# Patient Record
Sex: Male | Born: 1980 | Race: Black or African American | Hispanic: No | Marital: Married | Smoking: Current some day smoker
Health system: Southern US, Community
[De-identification: ages and names within clinical notes are randomized; demographics above are authoritative.]

## PROBLEM LIST (undated history)

## (undated) DIAGNOSIS — S62309A Unspecified fracture of unspecified metacarpal bone, initial encounter for closed fracture: Secondary | ICD-10-CM

## (undated) DIAGNOSIS — R05 Cough: Secondary | ICD-10-CM

## (undated) DIAGNOSIS — A539 Syphilis, unspecified: Secondary | ICD-10-CM

## (undated) DIAGNOSIS — R053 Chronic cough: Secondary | ICD-10-CM

## (undated) DIAGNOSIS — B009 Herpesviral infection, unspecified: Secondary | ICD-10-CM

## (undated) DIAGNOSIS — Z8489 Family history of other specified conditions: Secondary | ICD-10-CM

## (undated) DIAGNOSIS — G43909 Migraine, unspecified, not intractable, without status migrainosus: Secondary | ICD-10-CM

## (undated) HISTORY — PX: KNEE SURGERY: SHX244

---
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
## 2010-10-28 ENCOUNTER — Ambulatory Visit (INDEPENDENT_AMBULATORY_CARE_PROVIDER_SITE_OTHER): Payer: Self-pay

## 2010-10-28 ENCOUNTER — Inpatient Hospital Stay (INDEPENDENT_AMBULATORY_CARE_PROVIDER_SITE_OTHER)
Admission: RE | Admit: 2010-10-28 | Discharge: 2010-10-28 | Disposition: A | Discharge status: Home / Self Care | Payer: Self-pay | Source: Ambulatory Visit | Attending: Family Medicine | Admitting: Family Medicine

## 2010-10-28 DIAGNOSIS — J40 Bronchitis, not specified as acute or chronic: Secondary | ICD-10-CM

## 2010-10-28 DIAGNOSIS — R071 Chest pain on breathing: Secondary | ICD-10-CM

## 2010-10-29 ENCOUNTER — Emergency Department (HOSPITAL_COMMUNITY)
Admission: EM | Admit: 2010-10-29 | Discharge: 2010-10-29 | Disposition: A | Discharge status: Home / Self Care | Payer: Self-pay | Attending: Emergency Medicine | Admitting: Emergency Medicine

## 2010-10-29 DIAGNOSIS — R059 Cough, unspecified: Secondary | ICD-10-CM | POA: Insufficient documentation

## 2010-10-29 DIAGNOSIS — R079 Chest pain, unspecified: Secondary | ICD-10-CM | POA: Insufficient documentation

## 2010-10-29 DIAGNOSIS — R05 Cough: Secondary | ICD-10-CM | POA: Insufficient documentation

## 2010-10-29 DIAGNOSIS — R062 Wheezing: Secondary | ICD-10-CM | POA: Insufficient documentation

## 2010-10-29 DIAGNOSIS — J40 Bronchitis, not specified as acute or chronic: Secondary | ICD-10-CM | POA: Insufficient documentation

## 2011-02-04 ENCOUNTER — Inpatient Hospital Stay (INDEPENDENT_AMBULATORY_CARE_PROVIDER_SITE_OTHER)
Admission: RE | Admit: 2011-02-04 | Discharge: 2011-02-04 | Disposition: A | Discharge status: Home / Self Care | Payer: Self-pay | Source: Ambulatory Visit | Attending: Family Medicine | Admitting: Family Medicine

## 2011-02-04 DIAGNOSIS — J069 Acute upper respiratory infection, unspecified: Secondary | ICD-10-CM

## 2011-02-04 DIAGNOSIS — J40 Bronchitis, not specified as acute or chronic: Secondary | ICD-10-CM

## 2011-03-17 ENCOUNTER — Emergency Department (HOSPITAL_COMMUNITY)
Admission: EM | Admit: 2011-03-17 | Discharge: 2011-03-17 | Disposition: A | Discharge status: Home / Self Care | Payer: Self-pay | Attending: Emergency Medicine | Admitting: Emergency Medicine

## 2011-03-17 DIAGNOSIS — S8010XA Contusion of unspecified lower leg, initial encounter: Secondary | ICD-10-CM | POA: Insufficient documentation

## 2011-03-17 DIAGNOSIS — Y92009 Unspecified place in unspecified non-institutional (private) residence as the place of occurrence of the external cause: Secondary | ICD-10-CM | POA: Insufficient documentation

## 2011-03-17 DIAGNOSIS — Y93E1 Activity, personal bathing and showering: Secondary | ICD-10-CM | POA: Insufficient documentation

## 2011-03-17 DIAGNOSIS — S8990XA Unspecified injury of unspecified lower leg, initial encounter: Secondary | ICD-10-CM | POA: Insufficient documentation

## 2011-03-17 DIAGNOSIS — M79609 Pain in unspecified limb: Secondary | ICD-10-CM | POA: Insufficient documentation

## 2011-03-17 DIAGNOSIS — S99929A Unspecified injury of unspecified foot, initial encounter: Secondary | ICD-10-CM | POA: Insufficient documentation

## 2011-03-17 DIAGNOSIS — W010XXA Fall on same level from slipping, tripping and stumbling without subsequent striking against object, initial encounter: Secondary | ICD-10-CM | POA: Insufficient documentation

## 2011-04-24 ENCOUNTER — Encounter: Payer: Self-pay | Admitting: Emergency Medicine

## 2011-04-24 ENCOUNTER — Emergency Department (HOSPITAL_COMMUNITY): Payer: Self-pay

## 2011-04-24 ENCOUNTER — Emergency Department (HOSPITAL_COMMUNITY)
Admission: EM | Admit: 2011-04-24 | Discharge: 2011-04-24 | Disposition: A | Discharge status: Home / Self Care | Payer: Self-pay | Attending: Emergency Medicine | Admitting: Emergency Medicine

## 2011-04-24 DIAGNOSIS — M79609 Pain in unspecified limb: Secondary | ICD-10-CM | POA: Insufficient documentation

## 2011-04-24 DIAGNOSIS — IMO0002 Reserved for concepts with insufficient information to code with codable children: Secondary | ICD-10-CM | POA: Insufficient documentation

## 2011-04-24 DIAGNOSIS — T07XXXA Unspecified multiple injuries, initial encounter: Secondary | ICD-10-CM

## 2011-04-24 DIAGNOSIS — S6990XA Unspecified injury of unspecified wrist, hand and finger(s), initial encounter: Secondary | ICD-10-CM | POA: Insufficient documentation

## 2011-04-24 MED ORDER — OXYCODONE-ACETAMINOPHEN 5-325 MG PO TABS
2.0000 | ORAL_TABLET | Freq: Once | ORAL | Status: AC
  Administered 2011-04-24: 2 via ORAL
  Filled 2011-04-24: qty 2

## 2011-04-24 MED ORDER — TETANUS-DIPHTH-ACELL PERTUSSIS 5-2.5-18.5 LF-MCG/0.5 IM SUSP
0.5000 mL | Freq: Once | INTRAMUSCULAR | Status: AC
  Administered 2011-04-24: 0.5 mL via INTRAMUSCULAR
  Filled 2011-04-24: qty 0.5

## 2011-04-24 MED ORDER — OXYCODONE-ACETAMINOPHEN 5-325 MG PO TABS: 1.0000 | ORAL_TABLET | ORAL | Status: AC | PRN
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
## 2011-05-08 NOTE — ED Provider Notes (Signed)
Medical screening examination/treatment/procedure(s) were performed by non-physician practitioner and as supervising physician I was immediately available for consultation/collaboration.  Donnetta Hutching, MD 05/08/11 [phone]

## 2011-11-04 ENCOUNTER — Emergency Department (HOSPITAL_COMMUNITY): Payer: Self-pay

## 2011-11-04 ENCOUNTER — Emergency Department (HOSPITAL_COMMUNITY)
Admission: EM | Admit: 2011-11-04 | Discharge: 2011-11-04 | Disposition: A | Discharge status: Home / Self Care | Payer: No Typology Code available for payment source | Attending: Emergency Medicine | Admitting: Emergency Medicine

## 2011-11-04 ENCOUNTER — Emergency Department (HOSPITAL_COMMUNITY): Payer: No Typology Code available for payment source

## 2011-11-04 ENCOUNTER — Encounter (HOSPITAL_COMMUNITY): Payer: Self-pay | Admitting: Emergency Medicine

## 2011-11-04 DIAGNOSIS — M542 Cervicalgia: Secondary | ICD-10-CM | POA: Insufficient documentation

## 2011-11-04 DIAGNOSIS — M545 Low back pain, unspecified: Secondary | ICD-10-CM | POA: Insufficient documentation

## 2011-11-04 DIAGNOSIS — J45909 Unspecified asthma, uncomplicated: Secondary | ICD-10-CM | POA: Insufficient documentation

## 2011-11-04 DIAGNOSIS — Y998 Other external cause status: Secondary | ICD-10-CM | POA: Insufficient documentation

## 2011-11-04 DIAGNOSIS — Y92410 Unspecified street and highway as the place of occurrence of the external cause: Secondary | ICD-10-CM | POA: Insufficient documentation

## 2011-11-04 DIAGNOSIS — S0180XA Unspecified open wound of other part of head, initial encounter: Secondary | ICD-10-CM | POA: Insufficient documentation

## 2011-11-04 DIAGNOSIS — F17200 Nicotine dependence, unspecified, uncomplicated: Secondary | ICD-10-CM | POA: Insufficient documentation

## 2011-11-04 DIAGNOSIS — R51 Headache: Secondary | ICD-10-CM | POA: Insufficient documentation

## 2011-11-04 DIAGNOSIS — M25519 Pain in unspecified shoulder: Secondary | ICD-10-CM | POA: Insufficient documentation

## 2011-11-04 DIAGNOSIS — I10 Essential (primary) hypertension: Secondary | ICD-10-CM | POA: Insufficient documentation

## 2011-11-04 MED ORDER — CYCLOBENZAPRINE HCL 10 MG PO TABS: 10.0000 mg | ORAL_TABLET | Freq: Two times a day (BID) | ORAL | Status: AC | PRN

## 2011-11-04 MED ORDER — MORPHINE SULFATE 4 MG/ML IJ SOLN
4.0000 mg | Freq: Once | INTRAMUSCULAR | Status: AC
  Administered 2011-11-04: 4 mg via INTRAVENOUS
  Filled 2011-11-04: qty 1

## 2011-11-04 MED ORDER — HYDROCODONE-ACETAMINOPHEN 5-325 MG PO TABS: 1.0000 | ORAL_TABLET | Freq: Four times a day (QID) | ORAL | Status: AC | PRN
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
## 2011-11-04 NOTE — ED Provider Notes (Signed)
Medical screening examination/treatment/procedure(s) were performed by non-physician practitioner and as supervising physician I was immediately available for consultation/collaboration.   Celene Kras, MD 11/04/11 2056
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
## 2011-11-04 NOTE — ED Notes (Signed)
Pt ambulated with a steady gait; VSS; A&Ox3; no signs of distress; no questions at this time; respirations even and unlabored; skin warm and dry.  

## 2011-11-04 NOTE — ED Notes (Signed)
Attempted to ambulate pt but pt requests to wait due to receiving medication.
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
## 2011-11-04 NOTE — ED Notes (Signed)
Pt ambulated without distress. 

## 2011-11-04 NOTE — ED Notes (Signed)
Family at bedside. 

## 2012-05-29 ENCOUNTER — Encounter (HOSPITAL_COMMUNITY): Payer: Self-pay | Admitting: *Deleted

## 2012-05-29 ENCOUNTER — Emergency Department (HOSPITAL_COMMUNITY)
Admission: EM | Admit: 2012-05-29 | Discharge: 2012-05-29 | Disposition: A | Discharge status: Home / Self Care | Payer: Self-pay | Attending: Emergency Medicine | Admitting: Emergency Medicine

## 2012-05-29 DIAGNOSIS — F17200 Nicotine dependence, unspecified, uncomplicated: Secondary | ICD-10-CM | POA: Insufficient documentation

## 2012-05-29 DIAGNOSIS — Z8709 Personal history of other diseases of the respiratory system: Secondary | ICD-10-CM | POA: Insufficient documentation

## 2012-05-29 DIAGNOSIS — R112 Nausea with vomiting, unspecified: Secondary | ICD-10-CM | POA: Insufficient documentation

## 2012-05-29 DIAGNOSIS — J111 Influenza due to unidentified influenza virus with other respiratory manifestations: Secondary | ICD-10-CM

## 2012-05-29 DIAGNOSIS — R52 Pain, unspecified: Secondary | ICD-10-CM | POA: Insufficient documentation

## 2012-05-29 DIAGNOSIS — I10 Essential (primary) hypertension: Secondary | ICD-10-CM | POA: Insufficient documentation

## 2012-05-29 DIAGNOSIS — R05 Cough: Secondary | ICD-10-CM | POA: Insufficient documentation

## 2012-05-29 DIAGNOSIS — J029 Acute pharyngitis, unspecified: Secondary | ICD-10-CM | POA: Insufficient documentation

## 2012-05-29 DIAGNOSIS — R059 Cough, unspecified: Secondary | ICD-10-CM | POA: Insufficient documentation

## 2012-05-29 LAB — COMPREHENSIVE METABOLIC PANEL
ALT: 15 U/L (ref 0–53)
AST: 22 U/L (ref 0–37)
Alkaline Phosphatase: 88 U/L (ref 39–117)
CO2: 26 mEq/L (ref 19–32)
Calcium: 9.6 mg/dL (ref 8.4–10.5)
Chloride: 98 mEq/L (ref 96–112)
GFR calc non Af Amer: 72 mL/min — ABNORMAL LOW (ref >90)
Glucose, Bld: 98 mg/dL (ref 70–99)
Potassium: 4.1 mEq/L (ref 3.5–5.1)
Sodium: 134 mEq/L — ABNORMAL LOW (ref 135–145)
Total Bilirubin: 0.8 mg/dL (ref 0.3–1.2)

## 2012-05-29 LAB — CBC WITH DIFFERENTIAL/PLATELET
Basophils Absolute: 0 10*3/uL (ref 0.0–0.1)
Eosinophils Relative: 0 % (ref 0–5)
Lymphocytes Relative: 25 % (ref 12–46)
Monocytes Relative: 19 % — ABNORMAL HIGH (ref 3–12)
Neutrophils Relative %: 56 % (ref 43–77)
Platelets: 157 10*3/uL (ref 150–400)
RBC: 4.69 MIL/uL (ref 4.22–5.81)
RDW: 12.6 % (ref 11.5–15.5)
WBC: 6.4 10*3/uL (ref 4.0–10.5)

## 2012-05-29 MED ORDER — SODIUM CHLORIDE 0.9 % IV BOLUS (SEPSIS)
1000.0000 mL | Freq: Once | INTRAVENOUS | Status: AC
  Administered 2012-05-29: 1000 mL via INTRAVENOUS

## 2012-05-29 MED ORDER — ACETAMINOPHEN 325 MG PO TABS
650.0000 mg | ORAL_TABLET | Freq: Once | ORAL | Status: AC
  Administered 2012-05-29: 650 mg via ORAL
  Filled 2012-05-29: qty 2

## 2012-05-29 MED ORDER — OSELTAMIVIR PHOSPHATE 75 MG PO CAPS: 75.0000 mg | ORAL_CAPSULE | Freq: Two times a day (BID) | ORAL | Status: DC

## 2012-05-29 MED ORDER — ONDANSETRON 8 MG PO TBDP
8.0000 mg | ORAL_TABLET | Freq: Once | ORAL | Status: AC
  Administered 2012-05-29: 8 mg via ORAL
  Filled 2012-05-29: qty 1

## 2012-05-29 NOTE — ED Notes (Signed)
Gave urinal.  

## 2012-05-29 NOTE — ED Notes (Signed)
Voiced understanding of instructions given 

## 2012-05-29 NOTE — ED Provider Notes (Signed)
History     CSN: [id number]  Arrival date & time 05/29/12  1248   First MD Initiated Contact with Patient 05/29/12 1444      Chief Complaint  Patient presents with  . Fever  . Generalized Body Aches  . Sore Throat  . Cough    (Consider location/radiation/quality/duration/timing/severity/associated sxs/prior treatment) HPI Comments: Pt presents to the ED with complaints of flu-like symptoms of cough, congestion, sore throat, muscle aches, chills, fever, headache, abdominal pain, vomiting, decreased appetite, and fatigue. The patient states that the symptoms started two days ago.  Pt has been around other sick contacts and did not get the flu shot this year. The patient denies neck pain or stiffness, weakness, vision changes, severe abdominal pain, inability to eat or drink, difficulty breathing, SOB, wheezing, or chest pain. The patient has taken Alka-Seltzer Plus, without relief of symptoms. The patient is otherwise healthy.    Patient is a 32 y.o. male presenting with fever, pharyngitis, and cough. The history is provided by the patient.  Fever Primary symptoms of the febrile illness include fever, fatigue, headaches, cough, abdominal pain, nausea and vomiting. Primary symptoms do not include wheezing, shortness of breath, diarrhea or rash.  The headache is not associated with neck stiffness.  Sore Throat Associated symptoms include abdominal pain, chills, coughing, fatigue, a fever, headaches, nausea, a sore throat and vomiting. Pertinent negatives include no rash.  Cough Associated symptoms include chills, headaches and sore throat. Pertinent negatives include no shortness of breath and no wheezing.    Past Medical History  Diagnosis Date  . Hypertension   . Asthma     Past Surgical History  Procedure Date  . Knee surgery     Family History  Problem Relation Age of Onset  . Hypertension Mother   . Diabetes Mother   . Cancer Mother   . Cancer Father   . Diabetes Father    . Hypertension Father     History  Substance Use Topics  . Smoking status: Current Some Day Smoker  . Smokeless tobacco: Not on file  . Alcohol Use: 1.2 oz/week    2 Cans of beer per week     Comment: occasionally      Review of Systems  Constitutional: Positive for fever, chills, appetite change and fatigue.  HENT: Positive for sore throat. Negative for drooling, trouble swallowing, neck stiffness and voice change.   Respiratory: Positive for cough. Negative for shortness of breath and wheezing.   Gastrointestinal: Positive for nausea, vomiting and abdominal pain. Negative for diarrhea.  Skin: Negative for rash.  Neurological: Positive for headaches. Negative for dizziness, syncope and light-headedness.  Psychiatric/Behavioral: Negative for confusion.    Allergies  Bactrim; Coconut oil; and Pork-derived products  Home Medications   Current Outpatient Rx  Name  Route  Sig  Dispense  Refill  . ALKA-SELTZER PLUS COLD PO   Oral   Take 2 tablets by mouth at bedtime as needed. FOR COLD SYMPTOMS.           BP 169/92  Pulse 89  Temp 100.1 F (37.8 C) (Oral)  Resp 18  SpO2 99%  Physical Exam  Nursing note and vitals reviewed. Constitutional: He appears well-developed and well-nourished. No distress.  HENT:  Head: Normocephalic and atraumatic. No trismus in the jaw.  Right Ear: Tympanic membrane and ear canal normal.  Left Ear: Tympanic membrane and ear canal normal.  Nose: Mucosal edema and rhinorrhea present.  Mouth/Throat: Uvula is midline and mucous membranes  are normal. No uvula swelling. Posterior oropharyngeal erythema present. No oropharyngeal exudate, posterior oropharyngeal edema or tonsillar abscesses.  Neck: Normal range of motion. Neck supple.  Cardiovascular: Normal rate, regular rhythm and normal heart sounds.   Pulmonary/Chest: Effort normal and breath sounds normal. No respiratory distress. He has no wheezes. He has no rales.  Abdominal: Soft.  Bowel sounds are normal. He exhibits no distension and no mass. There is tenderness. There is no rebound and no guarding.       Mild diffuse abdominal tenderness  Neurological: He is alert.  Skin: Skin is warm and dry. No rash noted. He is not diaphoretic.  Psychiatric: He has a normal mood and affect.    ED Course  Procedures (including critical care time)  Labs Reviewed  CBC WITH DIFFERENTIAL - Abnormal; Notable for the following:    MCHC 36.2 (*)     Monocytes Relative 19 (*)     Monocytes Absolute 1.2 (*)     All other components within normal limits  COMPREHENSIVE METABOLIC PANEL - Abnormal; Notable for the following:    Sodium 134 (*)     GFR calc non Af Amer 72 (*)     GFR calc Af Amer 83 (*)     All other components within normal limits   No results found.   No diagnosis found.  Patient able to tolerate PO liquids.    MDM  Patient with symptoms consistent with influenza.  Vitals are stable.  Fever and tachycardia improved with Tylenol administration and IVF.  Patient tolerating PO's.  Lungs are clear. Due to patient's presentation and physical exam a chest x-ray was not ordered bc likely diagnosis of flu.  No tachypnea.  Pulse ox 99 on RA.  Discussed the cost versus benefit of Tamiflu treatment with the patient.  The patient is within 48 hours of the onset of symptoms.  Therefore, patient given treatment of Tamiflu.  Patient will be discharged with instructions to orally hydrate, rest, and use over-the-counter medications such as anti-inflammatories ibuprofen and Aleve for muscle aches and Tylenol for fever.          Pascal Lux [place], PA-C 05/30/12 1245

## 2012-05-29 NOTE — ED Notes (Signed)
States that he has had body aches and loss of appetite for the past 3 days. Removed patients blanket and advised not to put it back on due to fever.

## 2012-05-29 NOTE — ED Notes (Signed)
Pt reports fever, generalized body aches, cough, sore throat and chills x2 days. Mask placed on pt for flu like symptoms.

## 2012-05-29 NOTE — ED Notes (Signed)
PA at bedside.

## 2012-06-01 NOTE — ED Provider Notes (Signed)
Medical screening examination/treatment/procedure(s) were performed by non-physician practitioner and as supervising physician I was immediately available for consultation/collaboration.  Rhett Mutschler, MD 06/01/12 1547 

## 2012-08-18 IMAGING — CR DG CHEST 2V
2 series · 2 of 2 positions shown · non-contrast
Comparison: None.

CLINICAL DATA: Hemoptysis

CHEST - 2 VIEW

[view not recorded (1 of 2)]
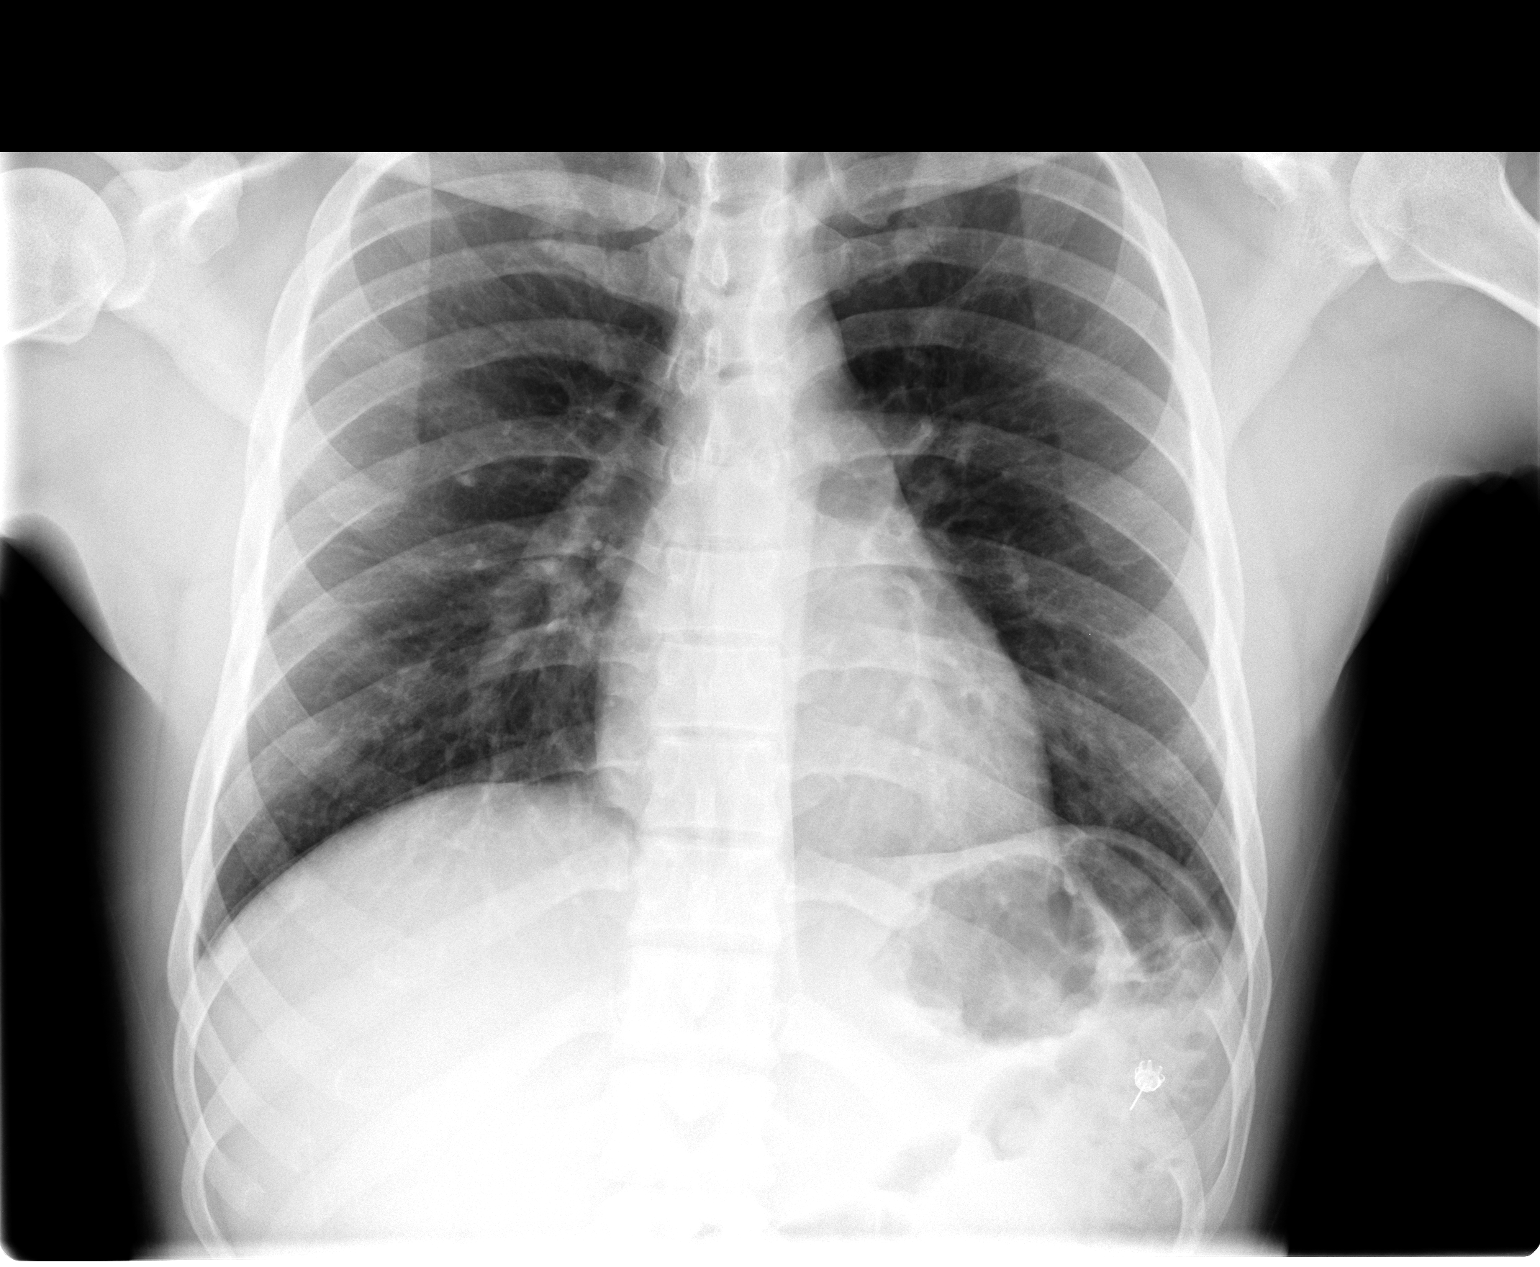

[view not recorded (2 of 2)]
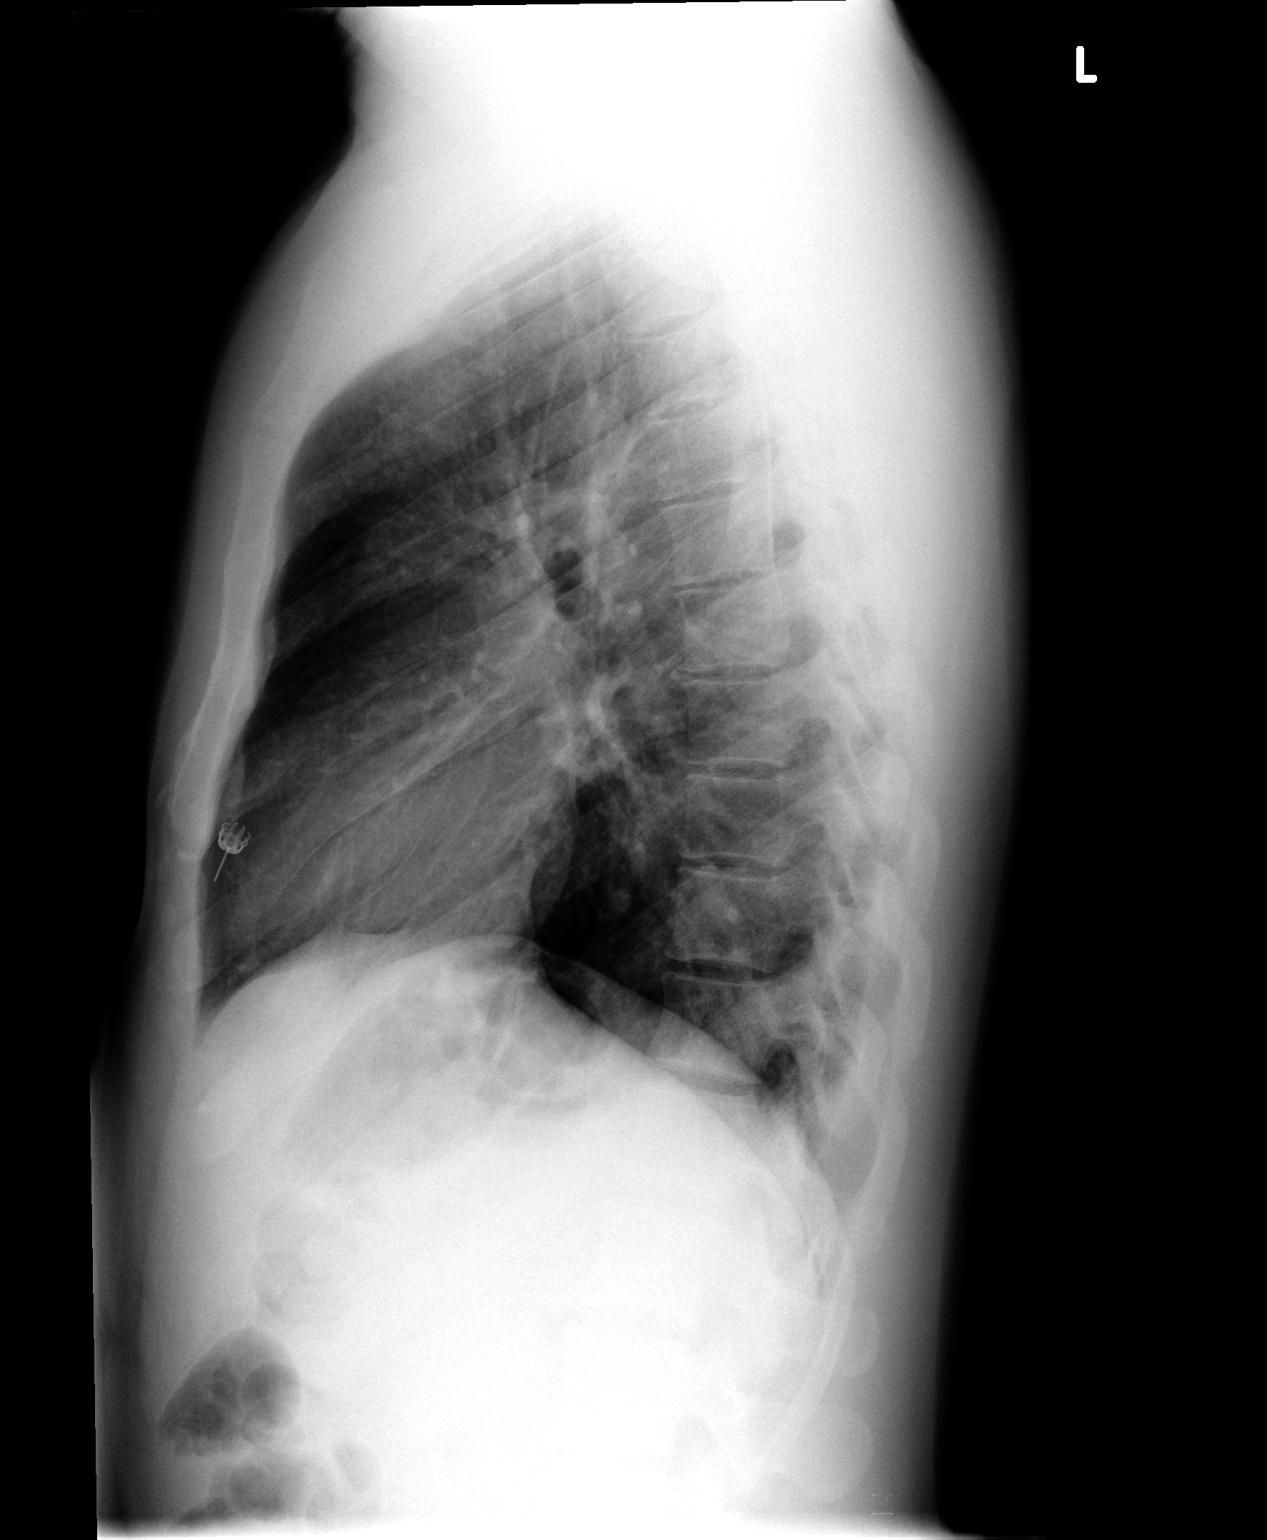

[2 of 2 positions shown; findings below may reference images not displayed]

FINDINGS: The heart and lungs are normal. No pleural fluid or
osseous lesions.

There is metallic artifact projecting over the left anterior chest
wall which is probably a ladies pierced earring, perhaps in the
patient's left shirt pocket.
IMPRESSION: No active disease.

## 2013-05-10 ENCOUNTER — Emergency Department (HOSPITAL_COMMUNITY)
Admission: EM | Admit: 2013-05-10 | Discharge: 2013-05-10 | Disposition: A | Discharge status: Home / Self Care | Payer: Self-pay | Attending: Emergency Medicine | Admitting: Emergency Medicine

## 2013-05-10 ENCOUNTER — Encounter (HOSPITAL_COMMUNITY): Payer: Self-pay | Admitting: Emergency Medicine

## 2013-05-10 DIAGNOSIS — F17200 Nicotine dependence, unspecified, uncomplicated: Secondary | ICD-10-CM | POA: Insufficient documentation

## 2013-05-10 DIAGNOSIS — R062 Wheezing: Secondary | ICD-10-CM | POA: Insufficient documentation

## 2013-05-10 DIAGNOSIS — Z8709 Personal history of other diseases of the respiratory system: Secondary | ICD-10-CM | POA: Insufficient documentation

## 2013-05-10 DIAGNOSIS — I10 Essential (primary) hypertension: Secondary | ICD-10-CM | POA: Insufficient documentation

## 2013-05-10 DIAGNOSIS — R21 Rash and other nonspecific skin eruption: Secondary | ICD-10-CM | POA: Insufficient documentation

## 2013-05-10 DIAGNOSIS — Z881 Allergy status to other antibiotic agents status: Secondary | ICD-10-CM | POA: Insufficient documentation

## 2013-05-10 MED ORDER — DIPHENHYDRAMINE HCL 25 MG PO CAPS
25.0000 mg | ORAL_CAPSULE | Freq: Once | ORAL | Status: AC
  Administered 2013-05-10: 25 mg via ORAL
  Filled 2013-05-10: qty 1

## 2013-05-10 MED ORDER — ALBUTEROL SULFATE HFA 108 (90 BASE) MCG/ACT IN AERS
2.0000 | INHALATION_SPRAY | RESPIRATORY_TRACT | Status: DC | PRN
  Administered 2013-05-10: 2 via RESPIRATORY_TRACT
  Filled 2013-05-10: qty 6.7

## 2013-05-10 MED ORDER — DIPHENHYDRAMINE HCL 25 MG PO TABS: 25.0000 mg | ORAL_TABLET | Freq: Four times a day (QID) | ORAL | Status: DC

## 2013-05-10 MED ORDER — PERMETHRIN 5 % EX CREA: TOPICAL_CREAM | CUTANEOUS | Status: DC

## 2013-05-10 NOTE — ED Provider Notes (Signed)
Medical screening examination/treatment/procedure(s) were performed by non-physician practitioner and as supervising physician I was immediately available for consultation/collaboration.  EKG Interpretation   None         Rathana Viveros W Olevia Westervelt, MD 05/10/13 1522 

## 2013-05-10 NOTE — ED Notes (Signed)
Pt arrives with rash to entire body and scratching for the last month. Pt also states he feels as though his asthma acting up and has run out of meds.

## 2013-05-10 NOTE — ED Provider Notes (Signed)
CSN: [id number]     Arrival date & time 05/10/13  0810 History   First MD Initiated Contact with Patient 05/10/13 0813     Chief Complaint  Patient presents with  . Rash  . Asthma   (Consider location/radiation/quality/duration/timing/severity/associated sxs/prior Treatment) HPI Comments: Patient presents with complaint of rash for the past one month. Rash is full-body and extremely itchy. No fever, anaphylaxis symptoms. No treatments prior. No new medications or skin exposures. No one else in the house has a similar problem. Patient states that where he is currently living is being condemned. Also states he has a history of asthma and has had increased wheezing over the past week. He does not have an inhaler. Mild cough, no other URI symptoms. The onset of this condition was acute. The course is constant. Aggravating factors: none. Alleviating factors: none.     Patient is a 32 y.o. male presenting with rash and asthma.  Rash Associated symptoms: wheezing   Associated symptoms: no abdominal pain, no diarrhea, no fever, no headaches, no myalgias, no nausea, no sore throat and not vomiting   Asthma Associated symptoms include coughing and a rash. Pertinent negatives include no abdominal pain, chest pain, fever, headaches, myalgias, nausea, sore throat or vomiting.    Past Medical History  Diagnosis Date  . Hypertension   . Asthma    Past Surgical History  Procedure Laterality Date  . Knee surgery     Family History  Problem Relation Age of Onset  . Hypertension Mother   . Diabetes Mother   . Cancer Mother   . Cancer Father   . Diabetes Father   . Hypertension Father    History  Substance Use Topics  . Smoking status: Current Some Day Smoker -- 0.50 packs/day    Types: Cigarettes  . Smokeless tobacco: Not on file  . Alcohol Use: 1.2 oz/week    2 Cans of beer per week     Comment: occasionally    Review of Systems  Constitutional: Negative for fever.  HENT: Negative  for rhinorrhea and sore throat.   Eyes: Negative for redness.  Respiratory: Positive for cough and wheezing.   Cardiovascular: Negative for chest pain.  Gastrointestinal: Negative for nausea, vomiting, abdominal pain and diarrhea.  Genitourinary: Negative for dysuria.  Musculoskeletal: Negative for myalgias.  Skin: Positive for rash.  Neurological: Negative for headaches.    Allergies  Bactrim; Coconut oil; and Pork-derived products  Home Medications   Current Outpatient Rx  Name  Route  Sig  Dispense  Refill  . diphenhydrAMINE (BENADRYL) 25 MG tablet   Oral   Take 1 tablet (25 mg total) by mouth every 6 (six) hours.   20 tablet   0   . permethrin (ELIMITE) 5 % cream      Apply to body overnight for at least 8 hours.   60 g   1    BP 133/82  Pulse 74  Temp(Src) 97.6 F (36.4 C) (Oral)  Resp 16  Ht 5\' 9"  (1.753 m)  Wt 190 lb (86.183 kg)  BMI 28.05 kg/m2  SpO2 99% Physical Exam  Nursing note and vitals reviewed. Constitutional: He appears well-developed and well-nourished.  HENT:  Head: Normocephalic and atraumatic.  Mouth/Throat: Oropharynx is clear and moist.  No oral mucosal involvement. No swelling of lips or angioedema.  Eyes: Conjunctivae are normal. Right eye exhibits no discharge. Left eye exhibits no discharge.  Sclera clear.  Neck: Normal range of motion. Neck supple.  Cardiovascular:  Normal rate, regular rhythm and normal heart sounds.   Pulmonary/Chest: Effort normal and breath sounds normal. No respiratory distress. He has no wheezes. He has no rales.  Abdominal: Soft. There is no tenderness.  Neurological: He is alert.  Skin: Skin is warm and dry.  Patient with diffuse scattered punctate lesions consistent with scabies over upper and lower extremities, torso.  Psychiatric: He has a normal mood and affect.    ED Course  Procedures (including critical care time) Labs Review Labs Reviewed - No data to display Imaging Review No results  found.  EKG Interpretation   None      8:43 AM Patient seen and examined.   Vital signs reviewed and are as follows: Filed Vitals:   05/10/13 0816  BP: 133/82  Pulse: 74  Temp: 97.6 F (36.4 C)  Resp: 16   Counseled on use of permethrin.   Patient counseled on use of albuterol HFA.  Told to use 1-2 puffs q 4 hours as needed for SOB.'  Patient urged to return with worsening symptoms or other concerns. Patient verbalized understanding and agrees with plan.     MDM   1. Rash   2. Wheezing    Rash: suspect scabies 2/2 appearance and duration of symptoms. No signs SJS/TEN. No new meds. No fever or systemic symptoms.   Wheezing: No current problems. Albuterol inhaler provided for symptomatic relief.    Renne Crigler, PA-C 05/10/13 [phone]

## 2013-08-20 ENCOUNTER — Emergency Department (HOSPITAL_COMMUNITY)
Admission: EM | Admit: 2013-08-20 | Discharge: 2013-08-20 | Disposition: A | Discharge status: Home / Self Care | Payer: Self-pay | Attending: Emergency Medicine | Admitting: Emergency Medicine

## 2013-08-20 ENCOUNTER — Encounter (HOSPITAL_COMMUNITY): Payer: Self-pay | Admitting: Emergency Medicine

## 2013-08-20 ENCOUNTER — Emergency Department (HOSPITAL_COMMUNITY): Payer: Self-pay

## 2013-08-20 DIAGNOSIS — S060X9A Concussion with loss of consciousness of unspecified duration, initial encounter: Secondary | ICD-10-CM

## 2013-08-20 DIAGNOSIS — J45909 Unspecified asthma, uncomplicated: Secondary | ICD-10-CM | POA: Insufficient documentation

## 2013-08-20 DIAGNOSIS — F17200 Nicotine dependence, unspecified, uncomplicated: Secondary | ICD-10-CM | POA: Insufficient documentation

## 2013-08-20 DIAGNOSIS — S139XXA Sprain of joints and ligaments of unspecified parts of neck, initial encounter: Secondary | ICD-10-CM | POA: Insufficient documentation

## 2013-08-20 DIAGNOSIS — S060X0A Concussion without loss of consciousness, initial encounter: Secondary | ICD-10-CM | POA: Insufficient documentation

## 2013-08-20 DIAGNOSIS — S161XXA Strain of muscle, fascia and tendon at neck level, initial encounter: Secondary | ICD-10-CM

## 2013-08-20 DIAGNOSIS — I10 Essential (primary) hypertension: Secondary | ICD-10-CM | POA: Insufficient documentation

## 2013-08-20 DIAGNOSIS — S060XAA Concussion with loss of consciousness status unknown, initial encounter: Secondary | ICD-10-CM

## 2013-08-20 DIAGNOSIS — Y9351 Activity, roller skating (inline) and skateboarding: Secondary | ICD-10-CM | POA: Insufficient documentation

## 2013-08-20 DIAGNOSIS — Y9289 Other specified places as the place of occurrence of the external cause: Secondary | ICD-10-CM | POA: Insufficient documentation

## 2013-08-20 DIAGNOSIS — Z79899 Other long term (current) drug therapy: Secondary | ICD-10-CM | POA: Insufficient documentation

## 2013-08-20 MED ORDER — NAPROXEN 500 MG PO TABS: 500.0000 mg | ORAL_TABLET | Freq: Two times a day (BID) | ORAL | Status: DC

## 2013-08-20 MED ORDER — DIPHENHYDRAMINE HCL 50 MG/ML IJ SOLN
25.0000 mg | Freq: Once | INTRAMUSCULAR | Status: AC
  Administered 2013-08-20: 25 mg via INTRAVENOUS
  Filled 2013-08-20: qty 1

## 2013-08-20 MED ORDER — METOCLOPRAMIDE HCL 5 MG/ML IJ SOLN
10.0000 mg | Freq: Once | INTRAMUSCULAR | Status: AC
  Administered 2013-08-20: 10 mg via INTRAVENOUS
  Filled 2013-08-20: qty 2

## 2013-08-20 MED ORDER — DEXAMETHASONE SODIUM PHOSPHATE 10 MG/ML IJ SOLN
10.0000 mg | Freq: Once | INTRAMUSCULAR | Status: AC
  Administered 2013-08-20: 10 mg via INTRAVENOUS
  Filled 2013-08-20: qty 1

## 2013-08-20 MED ORDER — CYCLOBENZAPRINE HCL 10 MG PO TABS: 10.0000 mg | ORAL_TABLET | Freq: Two times a day (BID) | ORAL | Status: DC | PRN

## 2013-08-20 NOTE — ED Notes (Signed)
Patient transported to CT 

## 2013-08-20 NOTE — ED Notes (Signed)
Pt returned from CT; hooked back up to monitor

## 2013-08-20 NOTE — ED Notes (Signed)
Pt provided applejuice and graham crackers.

## 2013-08-20 NOTE — Discharge Instructions (Signed)
Cervical Sprain A cervical sprain is when the tissues (ligaments) that hold the neck bones in place stretch or tear. HOME CARE   Put ice on the injured area.  Put ice in a plastic bag.  Place a towel between your skin and the bag.  Leave the ice on for 15 20 minutes, 3 4 times a day.  You may have been given a collar to wear. This collar keeps your neck from moving while you heal.  Do not take the collar off unless told by your doctor.  If you have long hair, keep it outside of the collar.  Ask your doctor before changing the position of your collar. You may need to change its position over time to make it more comfortable.  If you are allowed to take off the collar for cleaning or bathing, follow your doctor's instructions on how to do it safely.  Keep your collar clean by wiping it with mild soap and water. Dry it completely. If the collar has removable pads, remove them every 1 2 days to hand wash them with soap and water. Allow them to air dry. They should be dry before you wear them in the collar.  Do not drive while wearing the collar.  Only take medicine as told by your doctor.  Keep all doctor visits as told.  Keep all physical therapy visits as told.  Adjust your work station so that you have good posture while you work.  Avoid positions and activities that make your problems worse.  Warm up and stretch before being active. GET HELP IF:  Your pain is not controlled with medicine.  You cannot take less pain medicine over time as planned.  Your activity level does not improve as expected. GET HELP RIGHT AWAY IF:   You are bleeding.  Your stomach is upset.  You have an allergic reaction to your medicine.  You develop new problems that you cannot explain.  You lose feeling (become numb) or you cannot move any part of your body (paralysis).  You have tingling or weakness in any part of your body.  Your symptoms get worse. Symptoms include:  Pain,  soreness, stiffness, puffiness (swelling), or a burning feeling in your neck.  Pain when your neck is touched.  Shoulder or upper back pain.  Limited ability to move your neck.  Headache.  Dizziness.  Your hands or arms feel week, lose feeling, or tingle.  Muscle spasms.  Difficulty swallowing or chewing. MAKE SURE YOU:   Understand these instructions.  Will watch your condition.  Will get help right away if you are not doing well or get worse. Document Released: 10/27/2007 Document Revised: 01/10/2013 Document Reviewed: 11/15/2012 [facility] Patient Information 2014 [place], [place].  Concussion, Adult A concussion, or closed-head injury, is a brain injury caused by a direct blow to the head or by a quick and sudden movement (jolt) of the head or neck. Concussions are usually not life-threatening. Even so, the effects of a concussion can be serious. If you have had a concussion before, you are more likely to experience concussion-like symptoms after a direct blow to the head.  CAUSES   Direct blow to the head, such as from running into another player during a soccer game, being hit in a fight, or hitting your head on a hard surface.  A jolt of the head or neck that causes the brain to move back and forth inside the skull, such as in a car crash. SIGNS AND  SYMPTOMS  The signs of a concussion can be hard to notice. Early on, they may be missed by you, family members, and health care providers. You may look fine but act or feel differently. Symptoms are usually temporary, but they may last for days, weeks, or even longer. Some symptoms may appear right away while others may not show up for hours or days. Every head injury is different. Symptoms include:   Mild to moderate headaches that will not go away.  A feeling of pressure inside your head.  Having more trouble than usual:   Learning or remembering things you have heard.  Answering questions.  Paying attention or  concentrating.   Organizing daily tasks.   Making decisions and solving problems.   Slowness in thinking, acting or reacting, speaking, or reading.   Getting lost or being easily confused.   Feeling tired all the time or lacking energy (fatigued).   Feeling drowsy.   Sleep disturbances.   Sleeping more than usual.   Sleeping less than usual.   Trouble falling asleep.   Trouble sleeping (insomnia).   Loss of balance or feeling lightheaded or dizzy.   Nausea or vomiting.   Numbness or tingling.   Increased sensitivity to:   Sounds.   Lights.   Distractions.   Vision problems or eyes that tire easily.   Diminished sense of taste or smell.   Ringing in the ears.   Mood changes such as feeling sad or anxious.   Becoming easily irritated or angry for little or no reason.   Lack of motivation.  Seeing or hearing things other people do not see or hear (hallucinations). DIAGNOSIS  Your health care provider can usually diagnose a concussion based on a description of your injury and symptoms. He or she will ask whether you passed out (lost consciousness) and whether you are having trouble remembering events that happened right before and during your injury.  Your evaluation might include:   A brain scan to look for signs of injury to the brain. Even if the test shows no injury, you may still have a concussion.   Blood tests to be sure other problems are not present. TREATMENT   Concussions are usually treated in an emergency department, in urgent care, or at a clinic. You may need to stay in the hospital overnight for further treatment.   Tell your health care provider if you are taking any medicines, including prescription medicines, over-the-counter medicines, and natural remedies. Some medicines, such as blood thinners (anticoagulants) and aspirin, may increase the chance of complications. Also tell your health care provider whether you  have had alcohol or are taking illegal drugs. This information may affect treatment.  Your health care provider will send you home with important instructions to follow.  How fast you will recover from a concussion depends on many factors. These factors include how severe your concussion is, what part of your brain was injured, your age, and how healthy you were before the concussion.  Most people with mild injuries recover fully. Recovery can take time. In general, recovery is slower in older persons. Also, persons who have had a concussion in the past or have other medical problems may find that it takes longer to recover from their current injury. HOME CARE INSTRUCTIONS  General Instructions  Carefully follow the directions your health care provider gave you.  Only take over-the-counter or prescription medicines for pain, discomfort, or fever as directed by your health care provider.  Take  only those medicines that your health care provider has approved.  Do not drink alcohol until your health care provider says you are well enough to do so. Alcohol and certain other drugs may slow your recovery and can put you at risk of further injury.  If it is harder than usual to remember things, write them down.  If you are easily distracted, try to do one thing at a time. For example, do not try to watch TV while fixing dinner.  Talk with family members or close friends when making important decisions.  Keep all follow-up appointments. Repeated evaluation of your symptoms is recommended for your recovery.  Watch your symptoms and tell others to do the same. Complications sometimes occur after a concussion. Older adults with a brain injury may have a higher risk of serious complications such as of a blood clot on the brain.  Tell your teachers, school nurse, school counselor, coach, athletic trainer, or work Production designer, theatre/television/film about your injury, symptoms, and restrictions. Tell them about what you can or  cannot do. They should watch for:   Increased problems with attention or concentration.   Increased difficulty remembering or learning new information.   Increased time needed to complete tasks or assignments.   Increased irritability or decreased ability to cope with stress.   Increased symptoms.   Rest. Rest helps the brain to heal. Make sure you:  Get plenty of sleep at night. Avoid staying up late at night.  Keep the same bedtime hours on weekends and weekdays.  Rest during the day. Take daytime naps or rest breaks when you feel tired.  Limit activities that require a lot of thought or concentration. These includes   Doing homework or job-related work.   Watching TV.   Working on the computer.  Avoid any situation where there is potential for another head injury (football, hockey, soccer, basketball, martial arts, downhill snow sports and horseback riding). Your condition will get worse every time you experience a concussion. You should avoid these activities until you are evaluated by the appropriate follow-up caregivers. Returning To Your Regular Activities You will need to return to your normal activities slowly, not all at once. You must give your body and brain enough time for recovery.  Do not return to sports or other athletic activities until your health care provider tells you it is safe to do so.  Ask your health care provider when you can drive, ride a bicycle, or operate heavy machinery. Your ability to react may be slower after a brain injury. Never do these activities if you are dizzy.  Ask your health care provider about when you can return to work or school. Preventing Another Concussion It is very important to avoid another brain injury, especially before you have recovered. In rare cases, another injury can lead to permanent brain damage, brain swelling, or death. The risk of this is greatest during the first 7 10 days after a head injury. Avoid  injuries by:   Wearing a seat belt when riding in a car.   Drinking alcohol only in moderation.   Wearing a helmet when biking, skiing, skateboarding, skating, or doing similar activities.  Avoiding activities that could lead to a second concussion, such as contact or recreational sports, until your health care provider says it is OK.  Taking safety measures in your home.   Remove clutter and tripping hazards from floors and stairways.   Use grab bars in bathrooms and handrails by stairs.  Place non-slip mats on floors and in bathtubs.   Improve lighting in dim areas. SEEK MEDICAL CARE IF:   You have increased problems paying attention or concentrating.   You have increased difficulty remembering or learning new information.   You need more time to complete tasks or assignments than before.   You have increased irritability or decreased ability to cope with stress.  You have more symptoms than before. Seek medical care if you have any of the following symptoms for more than 2 weeks after your injury:   Lasting (chronic) headaches.   Dizziness or balance problems.   Nausea.  Vision problems.   Increased sensitivity to noise or light.   Depression or mood swings.   Anxiety or irritability.   Memory problems.   Difficulty concentrating or paying attention.   Sleep problems.   Feeling tired all the time. SEEK IMMEDIATE MEDICAL CARE IF:   You have severe or worsening headaches. These may be a sign of a blood clot in the brain.  You have weakness (even if only in one hand, leg, or part of the face).  You have numbness.  You have decreased coordination.   You vomit repeatedly.  You have increased sleepiness.  One pupil is larger than the other.   You have convulsions.   You have slurred speech.   You have increased confusion. This may be a sign of a blood clot in the brain.  You have increased restlessness, agitation, or  irritability.   You are unable to recognize people or places.   You have neck pain.   It is difficult to wake you up.   You have unusual behavior changes.   You lose consciousness. MAKE SURE YOU:   Understand these instructions.  Will watch your condition.  Will get help right away if you are not doing well or get worse. Document Released: 07/31/2003 Document Revised: 01/10/2013 Document Reviewed: 11/30/2012 [facility] Patient Information 2014 [place], [place].

## 2013-08-20 NOTE — ED Notes (Signed)
Pt reports "falling on head" 2 or 3 times while roller skating on Saturday night. Pt denies loss of consciousness. Pt reports falling onto back of head.  Pt complains of HA since the fall. HA is concentrated on the right side of his head, and pt complains of slight vision changes in right eye and tenderness to right side of head. Pt reports slight dizziness and denies N/V or other complaints of pain. Pt is A&O and in NAD.
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
## 2013-12-02 ENCOUNTER — Emergency Department (HOSPITAL_COMMUNITY)
Admission: EM | Admit: 2013-12-02 | Discharge: 2013-12-03 | Disposition: A | Discharge status: Home / Self Care | Payer: Self-pay | Attending: Emergency Medicine | Admitting: Emergency Medicine

## 2013-12-02 ENCOUNTER — Emergency Department (HOSPITAL_COMMUNITY): Payer: Self-pay

## 2013-12-02 ENCOUNTER — Encounter (HOSPITAL_COMMUNITY): Payer: Self-pay | Admitting: Emergency Medicine

## 2013-12-02 DIAGNOSIS — S61412A Laceration without foreign body of left hand, initial encounter: Secondary | ICD-10-CM

## 2013-12-02 DIAGNOSIS — S61409A Unspecified open wound of unspecified hand, initial encounter: Secondary | ICD-10-CM | POA: Insufficient documentation

## 2013-12-02 DIAGNOSIS — S199XXA Unspecified injury of neck, initial encounter: Secondary | ICD-10-CM

## 2013-12-02 DIAGNOSIS — Z23 Encounter for immunization: Secondary | ICD-10-CM | POA: Insufficient documentation

## 2013-12-02 DIAGNOSIS — S0993XA Unspecified injury of face, initial encounter: Secondary | ICD-10-CM | POA: Insufficient documentation

## 2013-12-02 DIAGNOSIS — J45909 Unspecified asthma, uncomplicated: Secondary | ICD-10-CM | POA: Insufficient documentation

## 2013-12-02 DIAGNOSIS — S0990XA Unspecified injury of head, initial encounter: Secondary | ICD-10-CM | POA: Insufficient documentation

## 2013-12-02 DIAGNOSIS — F17200 Nicotine dependence, unspecified, uncomplicated: Secondary | ICD-10-CM | POA: Insufficient documentation

## 2013-12-02 DIAGNOSIS — I10 Essential (primary) hypertension: Secondary | ICD-10-CM | POA: Insufficient documentation

## 2013-12-02 MED ORDER — TETANUS-DIPHTH-ACELL PERTUSSIS 5-2.5-18.5 LF-MCG/0.5 IM SUSP
0.5000 mL | Freq: Once | INTRAMUSCULAR | Status: AC
  Administered 2013-12-02: 0.5 mL via INTRAMUSCULAR
  Filled 2013-12-02: qty 0.5

## 2013-12-02 MED ORDER — LIDOCAINE HCL (PF) 1 % IJ SOLN
5.0000 mL | Freq: Once | INTRAMUSCULAR | Status: AC
  Administered 2013-12-02: 5 mL via INTRADERMAL
  Filled 2013-12-02: qty 5

## 2013-12-02 MED ORDER — CEPHALEXIN 500 MG PO CAPS
500.0000 mg | ORAL_CAPSULE | Freq: Once | ORAL | Status: AC
  Administered 2013-12-02: 500 mg via ORAL
  Filled 2013-12-02: qty 1

## 2013-12-02 MED ORDER — CEPHALEXIN 500 MG PO CAPS: 500.0000 mg | ORAL_CAPSULE | Freq: Four times a day (QID) | ORAL | Status: DC

## 2013-12-02 MED ORDER — IBUPROFEN 800 MG PO TABS: 800.0000 mg | ORAL_TABLET | Freq: Three times a day (TID) | ORAL | Status: DC | PRN
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
## 2013-12-02 NOTE — Discharge Instructions (Signed)
Call dr. Jenetta Downerkeelings office for follow up this week,  Clean laceration twice a day with soap and water

## 2013-12-02 NOTE — ED Notes (Signed)
Hand soak with saline as ordered
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
## 2013-12-03 MED ORDER — HYDROCODONE-ACETAMINOPHEN 5-325 MG PO TABS
ORAL_TABLET | ORAL | Status: AC
  Filled 2013-12-03: qty 2

## 2013-12-03 MED ORDER — HYDROCODONE-ACETAMINOPHEN 5-325 MG PO TABS
2.0000 | ORAL_TABLET | Freq: Once | ORAL | Status: AC
  Administered 2013-12-03: 2 via ORAL

## 2013-12-03 NOTE — ED Provider Notes (Signed)
  I was asked by Dr. Estell HarpinZammit to repair a laceration to the proximal left thumb.  Pt remains NV intact.  Bleeding controlled.  Distal sensation of the thumb intact.  Laceration repair was my only involvement in this patient's care.    LACERATION REPAIR Performed by: Sherie Dobrowolski L. Authorized by: Maxwell CaulRIPLETT,Genee Rann L. Consent: Verbal consent obtained. Risks and benefits: risks, benefits and alternatives were discussed Consent given by: patient Patient identity confirmed: provided demographic data Prepped and Draped in normal sterile fashion Wound explored  Laceration Location: base left thumb  Laceration Length: 2 cm  No Foreign Bodies seen or palpated  Anesthesia: local infiltration  Local anesthetic: lidocaine 1% w/o epinephrine  Anesthetic total: 3 ml  Irrigation method: syringe Amount of cleaning: standard  Skin closure: 4-0 prolene  Number of sutures: 5  Technique: simple interrupted  Patient tolerance: Patient tolerated the procedure well with no immediate complications.    Dressing applied.  Patient feeling better  Ronav Furney L. Oluwatomisin Deman, PA-C 12/03/13 0023

## 2013-12-04 NOTE — ED Provider Notes (Signed)
Medical screening examination/treatment/procedure(s) were conducted as a shared visit with non-physician practitioner(s) and myself.  I personally evaluated the patient during the encounter.   EKG Interpretation None         EKG Interpretation None        Benny LennertJoseph L Rico Massar, MD 12/04/13 [phone]

## 2013-12-22 DIAGNOSIS — S62309A Unspecified fracture of unspecified metacarpal bone, initial encounter for closed fracture: Secondary | ICD-10-CM

## 2013-12-22 HISTORY — DX: Unspecified fracture of unspecified metacarpal bone, initial encounter for closed fracture: S62.309A

## 2013-12-31 ENCOUNTER — Emergency Department (HOSPITAL_COMMUNITY): Payer: Self-pay

## 2013-12-31 ENCOUNTER — Emergency Department (HOSPITAL_COMMUNITY)
Admission: EM | Admit: 2013-12-31 | Discharge: 2013-12-31 | Disposition: A | Discharge status: Home / Self Care | Payer: Self-pay | Attending: Emergency Medicine | Admitting: Emergency Medicine

## 2013-12-31 DIAGNOSIS — Z79899 Other long term (current) drug therapy: Secondary | ICD-10-CM | POA: Insufficient documentation

## 2013-12-31 DIAGNOSIS — S62309S Unspecified fracture of unspecified metacarpal bone, sequela: Secondary | ICD-10-CM

## 2013-12-31 DIAGNOSIS — I10 Essential (primary) hypertension: Secondary | ICD-10-CM | POA: Insufficient documentation

## 2013-12-31 DIAGNOSIS — M545 Low back pain: Secondary | ICD-10-CM

## 2013-12-31 DIAGNOSIS — R079 Chest pain, unspecified: Secondary | ICD-10-CM | POA: Insufficient documentation

## 2013-12-31 DIAGNOSIS — M79609 Pain in unspecified limb: Secondary | ICD-10-CM | POA: Insufficient documentation

## 2013-12-31 DIAGNOSIS — IMO0001 Reserved for inherently not codable concepts without codable children: Secondary | ICD-10-CM | POA: Insufficient documentation

## 2013-12-31 DIAGNOSIS — R209 Unspecified disturbances of skin sensation: Secondary | ICD-10-CM | POA: Insufficient documentation

## 2013-12-31 DIAGNOSIS — Z760 Encounter for issue of repeat prescription: Secondary | ICD-10-CM | POA: Insufficient documentation

## 2013-12-31 DIAGNOSIS — M5430 Sciatica, unspecified side: Secondary | ICD-10-CM | POA: Insufficient documentation

## 2013-12-31 DIAGNOSIS — J45909 Unspecified asthma, uncomplicated: Secondary | ICD-10-CM | POA: Insufficient documentation

## 2013-12-31 DIAGNOSIS — R0789 Other chest pain: Secondary | ICD-10-CM | POA: Insufficient documentation

## 2013-12-31 DIAGNOSIS — F17200 Nicotine dependence, unspecified, uncomplicated: Secondary | ICD-10-CM | POA: Insufficient documentation

## 2013-12-31 LAB — BASIC METABOLIC PANEL
Anion gap: 11 (ref 5–15)
BUN: 13 mg/dL (ref 6–23)
CALCIUM: 9.1 mg/dL (ref 8.4–10.5)
CO2: 23 mEq/L (ref 19–32)
CREATININE: 1.01 mg/dL (ref 0.50–1.35)
Chloride: 106 mEq/L (ref 96–112)
GFR calc Af Amer: >90 mL/min (ref >90)
Glucose, Bld: 102 mg/dL — ABNORMAL HIGH (ref 70–99)
POTASSIUM: 3.9 meq/L (ref 3.7–5.3)
Sodium: 140 mEq/L (ref 137–147)

## 2013-12-31 LAB — CBC
HEMATOCRIT: 37.7 % — AB (ref 39.0–52.0)
Hemoglobin: 13 g/dL (ref 13.0–17.0)
MCH: 32.7 pg (ref 26.0–34.0)
MCHC: 34.5 g/dL (ref 30.0–36.0)
MCV: 94.7 fL (ref 78.0–100.0)
Platelets: 194 10*3/uL (ref 150–400)
RBC: 3.98 MIL/uL — ABNORMAL LOW (ref 4.22–5.81)
RDW: 12.5 % (ref 11.5–15.5)
WBC: 6.9 10*3/uL (ref 4.0–10.5)

## 2013-12-31 LAB — I-STAT TROPONIN, ED: Troponin i, poc: 0 ng/mL (ref 0.00–0.08)

## 2013-12-31 MED ORDER — ALBUTEROL SULFATE (5 MG/ML) 0.5% IN NEBU: 2.5000 mg | INHALATION_SOLUTION | Freq: Four times a day (QID) | RESPIRATORY_TRACT | Status: DC | PRN

## 2013-12-31 MED ORDER — IBUPROFEN 800 MG PO TABS: 800.0000 mg | ORAL_TABLET | Freq: Three times a day (TID) | ORAL | Status: DC

## 2013-12-31 MED ORDER — TRAMADOL HCL 50 MG PO TABS: 50.0000 mg | ORAL_TABLET | Freq: Four times a day (QID) | ORAL | Status: DC | PRN

## 2013-12-31 MED ORDER — OXYCODONE-ACETAMINOPHEN 5-325 MG PO TABS
1.0000 | ORAL_TABLET | Freq: Once | ORAL | Status: AC
  Administered 2013-12-31: 1 via ORAL
  Filled 2013-12-31: qty 1

## 2013-12-31 MED ORDER — ALBUTEROL SULFATE HFA 108 (90 BASE) MCG/ACT IN AERS: 1.0000 | INHALATION_SPRAY | Freq: Four times a day (QID) | RESPIRATORY_TRACT | Status: AC | PRN

## 2013-12-31 NOTE — Discharge Instructions (Signed)
Call for a follow up appointment with a Family or Primary Care Provider.  Follow up with a hand specialist for further evaluation of your hand fracture. Call a cardiologist for further evaluation of your chest pain. Return if Symptoms worsen.   Take medication as prescribed.    Emergency Department Resource Guide 1) Find a Doctor and Pay Out of Pocket Although you won't have to find out who is covered by your insurance plan, it is a good idea to ask around and get recommendations. You will then need to call the office and see if the doctor you have chosen will accept you as a new patient and what types of options they offer for patients who are self-pay. Some doctors offer discounts or will set up payment plans for their patients who do not have insurance, but you will need to ask so you aren't surprised when you get to your appointment.  2) Contact Your Local Health Department Not all health departments have doctors that can see patients for sick visits, but many do, so it is worth a call to see if yours does. If you don't know where your local health department is, you can check in your phone book. The CDC also has a tool to help you locate your state's health department, and many state websites also have listings of all of their local health departments.  3) Find a Walk-in Clinic If your illness is not likely to be very severe or complicated, you may want to try a walk in clinic. These are popping up all over the country in pharmacies, drugstores, and shopping centers. They're usually staffed by nurse practitioners or physician assistants that have been trained to treat common illnesses and complaints. They're usually fairly quick and inexpensive. However, if you have serious medical issues or chronic medical problems, these are probably not your best option.  No Primary Care Doctor: - Call Health Connect at  [phone] - they can help you locate a primary care doctor that  accepts your insurance,  provides certain services, etc. - Physician Referral Service- [phone]  Chronic Pain Problems: Organization         Address  Phone   Notes  [facility]  [phone] Patients need to be referred by their primary care doctor.   Medication Assistance: Organization         Address  Phone   Notes  [facility] [address] [place], [place] [address] [phone] --Must be a resident of [facility] [place] -- Must have NO insurance coverage whatsoever (no Medicaid/ Medicare, etc.) -- The pt. MUST have a primary care doctor that directs their care regularly and follows them in the community   MedAssist  [phone]) [phone]   [employer]  [phone]    Agencies that provide inexpensive medical care: Organization         Address  Phone   Notes  [facility]  [phone]   [facility]    [phone]   [facility] [address] [place], [place] [address] [phone]   [facility] [address], [place] [phone]   [facility]    [phone]) [phone]   [facility]    [phone]   [facility]  [address], [place] Phone:  [phone], Fax:  [phone] Hours of Operation:  9 am -  6 pm, M-F.  Also accepts Medicaid/Medicare and self-pay.  [facility] for [place] [address], [address], [place] Phone: [phone], Fax: [phone]. Hours of Operation:  8:30 am - 5:30 pm, M-F.  Also accepts Medicaid and self-pay.  [facility] [place] [address] [place] Phone: [phone]   [place], [place], [place] [phone], Ext. [phone] Mondays & Thursdays: 7-9 AM.  First 15 patients are seen on a first come, first serve basis.    [place] Providers:  Organization         Address  Phone    Notes  [facility] [address], [place] [phone] Also accepts self-pay patients.  [facility] [address], [place]  [phone]   [place], [address], [place] [phone]   [facility] [address], [place] [phone]   Lucianne Lei [address], [place]   [phone] Only accepts [place] Access [place] patients after they have their name applied to their card.   Self-Pay (no insurance) in [facility]:  Organization         Address  Phone   Notes  Sickle Cell Patients, [facility] [place] [phone]   [facility] [place] [phone]   [facility] [place]  [place], [place], [place] [phone]   [facility]/Dr. Osei-Bonsu  [address] [place] or [address], [place] [phone] Phone number for both [place] and [place] locations is the same.  [facility] [address] [place] [phone]   [facility] [address], [place] or [address] [phone]   [facility] [address] [place] [phone], phone; [phone], fax Sees patients 1st and 3rd Saturday of every month.  Must not qualify for public or private insurance (i.e. Medicaid, Medicare, [place] Health Choice, Veterans' Benefits)  Household income should be no more than 200% of the poverty level The clinic cannot treat you if you are pregnant or think you are pregnant  Sexually transmitted diseases are not treated at the clinic.    Dental Care: Organization         Address  Phone  Notes  [facility] of [facility] [phone] Accepts children up to age 44 who are enrolled in [place] or [place]; pregnant women with a Medicaid card; and children who have applied for Medicaid or [place] Health Choice, but were declined, whose parents can pay a reduced fee at time of service.  [facility] Department of [facility]  [address], [place] [phone] Accepts children up to age 76 who are enrolled in [place] or [place]; pregnant women with a Medicaid card; and children who have applied for Medicaid or [place] Health Choice, but were declined, whose parents can pay a reduced fee at time of service.  [place] [facility] Access PROGRAM  [place] [phone] Patients are seen by appointment only. Walk-ins are not accepted. [place] will see patients 53 years of age and  older. Monday - Tuesday (8am-5pm) Most Wednesdays (8:30-5pm) $30 per visit, cash only  [facility] Adult [employer] PROGRAM  [address], [facility] [phone] Patients are seen by appointment only. Walk-ins are not accepted. [place] will see patients 46 years of age and older. One Wednesday Evening (Monthly: Volunteer Based).  $30 per visit, cash only  [place]  [phone] for adults; Children under age 44, call Graduate Pediatric Dentistry at [phone]. Children aged 62-14, please call [phone] to request a pediatric application.  Dental services are provided in all areas of dental care including fillings, crowns and bridges, complete and partial dentures, implants, gum treatment, root canals, and extractions. Preventive care is also provided. Treatment is provided to both adults and children. Patients are selected via a lottery and there is often a waiting list.   [facility] [address] [place]  [phone] [email or web address]   [facility] [address] [place], [place] [phone], Ext.  123 Second and Fourth Thursday of each month, opens at 6:30 AM; Clinic ends at 9 AM.  Patients are seen on a first-come first-served basis, and a limited number are seen during each clinic.   [facility]  [address] Hillard Danker [place], [place] [phone]   Eligibility Requirements You must have lived in [place], [place], or [place] counties for at least the last three months.   You cannot be eligible for state or federal sponsored [employer], including [employer], [place], or [employer].   You generally cannot be eligible for healthcare insurance through your employer.    How to apply: Eligibility screenings are held every Tuesday and Wednesday afternoon from 1:00 pm until 4:00 pm. You do not need an appointment for the interview!  [facility] [address] [place], [place]   [place]  [place] Department  [place]  [phone]    Behavioral Health Resources in the Community: Intensive Outpatient Programs Organization         Address  Phone  Notes  [facility]ia [place]. [address] [place], [place] [phone]   [facility] [address], [place], [place]   ADS: [facility] [address] [place], [place]   [place] [address],  [place], [place] or [phone]   Substance Abuse Resources Organization         Address  Phone  Notes  Alcohol and Drug Services  [phone]   [place]  [phone]   [facility] [place]   Chinita Pester  [phone]   Residential & Outpatient Substance Abuse Program  [phone]   Psychological Services Organization         Address  Phone  Notes  [facility]  [place]  [phone]   [place] [address], [place] [phone] or [phone]    Mobile Crisis Teams Organization         Address  Phone  Notes  [facility], Mobile Crisis Care Unit  [phone]   [facility]  [address] [place], [place]   [facility] [address], [place] [phone]    Self-Help/Support Groups Organization         Address  Phone             Notes  [facility] [place] - variety of support groups  [phone]- [phone] Call for more information  Narcotics Anonymous (NA), [facility] [address], [employer] [place]  2 meetings at this location   Statistician         Address  Phone  Notes  [facility] [address],    [place]  [phone]   [facility]  [address], [place] [address], [place], [place] [phone]   [facility] [address] [place], [place] [phone] Admissions: 8am-3pm M-F  [facility] [address],    [place], [place] [phone]   [facility] [address] [place], [place], [place] [phone]   [facility] [address],  [place], [place] [phone]   [facility] - Intensive Outpatient [address], [place], [place] [phone]   [facility] (Addiction Recovery Care Assoc.) [address] [place]  [place], [place] [phone] or 3147710884   [facility] (RTS) [address], [place], [place] [phone] Accepts Medicaid  [facility] [address],  [place] [phone] Substance Abuse/Addiction Treatment   [facility] Organization         Address  Phone  Notes  [facility]  [phone]   Angie Fava, PhD [address] Ervin Knack [place], [place]   [phone] or ([phone]   [facility]al   [address] [place], [place] [phone]   [facility] [address], [place], [place] [phone] Insurance/Medicaid/sponsorship through [facility] and Families [address]                                    [place], [place] [phone] Therapy/tele-psych/case  [facility] [address], [place] [phone]    Dr. Lolly Mustache  [phone]   [facility] [place]  United Way [facility] Dept. 1) [address], [place] 2) [address] [place] 3)  [address], [place] [phone]  [phone]   [facility] Child Abuse Hotline 218-436-6163 or [phone] (After Hours)
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
## 2013-12-31 NOTE — ED Notes (Signed)
Pt. Returned from xray 

## 2013-12-31 NOTE — ED Notes (Signed)
Declined W/C at D/C and was escorted to lobby by RN. 
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
## 2013-12-31 NOTE — ED Notes (Signed)
PA-C at bedside 

## 2014-01-02 NOTE — ED Provider Notes (Signed)
Medical screening examination/treatment/procedure(s) were performed by non-physician practitioner and as supervising physician I was immediately available for consultation/collaboration.   EKG Interpretation   Date/Time:  Monday December 31 2013 16:07:03 EDT Ventricular Rate:  57 PR Interval:  164 QRS Duration: 84 QT Interval:  366 QTC Calculation: 356 R Axis:   70 Text Interpretation:  Sinus bradycardia with sinus arrhythmia Otherwise  normal ECG ED PHYSICIAN INTERPRETATION AVAILABLE IN [facility]  Confirmed by TEST, Record ([id number]) on 01/02/2014 7:38:47 AM       Ethelda ChickMartha K Linker, MD 01/02/14 [phone]

## 2014-01-04 ENCOUNTER — Other Ambulatory Visit: Payer: Self-pay | Admitting: Orthopedic Surgery

## 2014-01-09 ENCOUNTER — Encounter (HOSPITAL_BASED_OUTPATIENT_CLINIC_OR_DEPARTMENT_OTHER): Payer: Self-pay | Admitting: *Deleted

## 2014-01-15 ENCOUNTER — Ambulatory Visit (HOSPITAL_BASED_OUTPATIENT_CLINIC_OR_DEPARTMENT_OTHER): Payer: Self-pay | Admitting: Anesthesiology

## 2014-01-15 ENCOUNTER — Ambulatory Visit (HOSPITAL_BASED_OUTPATIENT_CLINIC_OR_DEPARTMENT_OTHER)
Admission: RE | Admit: 2014-01-15 | Discharge: 2014-01-15 | Disposition: A | Discharge status: Home / Self Care | Payer: Self-pay | Source: Ambulatory Visit | Attending: Orthopedic Surgery | Admitting: Orthopedic Surgery

## 2014-01-15 ENCOUNTER — Encounter (HOSPITAL_BASED_OUTPATIENT_CLINIC_OR_DEPARTMENT_OTHER): Payer: Self-pay | Admitting: Anesthesiology

## 2014-01-15 ENCOUNTER — Encounter (HOSPITAL_BASED_OUTPATIENT_CLINIC_OR_DEPARTMENT_OTHER): Payer: Self-pay | Admitting: *Deleted

## 2014-01-15 ENCOUNTER — Encounter (HOSPITAL_BASED_OUTPATIENT_CLINIC_OR_DEPARTMENT_OTHER)
Admission: RE | Disposition: A | Discharge status: Home / Self Care | Payer: Self-pay | Source: Ambulatory Visit | Attending: Orthopedic Surgery

## 2014-01-15 DIAGNOSIS — F17200 Nicotine dependence, unspecified, uncomplicated: Secondary | ICD-10-CM | POA: Insufficient documentation

## 2014-01-15 DIAGNOSIS — Z882 Allergy status to sulfonamides status: Secondary | ICD-10-CM | POA: Insufficient documentation

## 2014-01-15 DIAGNOSIS — F329 Major depressive disorder, single episode, unspecified: Secondary | ICD-10-CM | POA: Insufficient documentation

## 2014-01-15 DIAGNOSIS — F3289 Other specified depressive episodes: Secondary | ICD-10-CM | POA: Insufficient documentation

## 2014-01-15 DIAGNOSIS — S62329A Displaced fracture of shaft of unspecified metacarpal bone, initial encounter for closed fracture: Secondary | ICD-10-CM | POA: Insufficient documentation

## 2014-01-15 DIAGNOSIS — J45909 Unspecified asthma, uncomplicated: Secondary | ICD-10-CM | POA: Insufficient documentation

## 2014-01-15 DIAGNOSIS — Y929 Unspecified place or not applicable: Secondary | ICD-10-CM | POA: Insufficient documentation

## 2014-01-15 HISTORY — PX: OPEN REDUCTION INTERNAL FIXATION (ORIF) METACARPAL: SHX6234

## 2014-01-15 LAB — POCT HEMOGLOBIN-HEMACUE: Hemoglobin: 15 g/dL (ref 13.0–17.0)

## 2014-01-15 SURGERY — OPEN REDUCTION INTERNAL FIXATION (ORIF) METACARPAL
Anesthesia: General | Site: Finger | Laterality: Left

## 2014-01-15 MED ORDER — HYDROMORPHONE HCL PF 1 MG/ML IJ SOLN
INTRAMUSCULAR | Status: AC
  Filled 2014-01-15: qty 1

## 2014-01-15 MED ORDER — BUPIVACAINE HCL (PF) 0.25 % IJ SOLN
INTRAMUSCULAR | Status: AC
  Filled 2014-01-15: qty 30

## 2014-01-15 MED ORDER — MIDAZOLAM HCL 5 MG/5ML IJ SOLN
INTRAMUSCULAR | Status: DC | PRN
  Administered 2014-01-15: 2 mg via INTRAVENOUS

## 2014-01-15 MED ORDER — CEFAZOLIN SODIUM-DEXTROSE 2-3 GM-% IV SOLR
2.0000 g | INTRAVENOUS | Status: AC
  Administered 2014-01-15: 2 g via INTRAVENOUS

## 2014-01-15 MED ORDER — LIDOCAINE HCL (CARDIAC) 20 MG/ML IV SOLN
INTRAVENOUS | Status: DC | PRN
  Administered 2014-01-15: 50 mg via INTRAVENOUS

## 2014-01-15 MED ORDER — BUPIVACAINE HCL (PF) 0.25 % IJ SOLN
INTRAMUSCULAR | Status: DC | PRN
  Administered 2014-01-15: 10 mL

## 2014-01-15 MED ORDER — LACTATED RINGERS IV SOLN
INTRAVENOUS | Status: DC
  Administered 2014-01-15: 12:00:00 via INTRAVENOUS
  Administered 2014-01-15: 10 mL/h via INTRAVENOUS

## 2014-01-15 MED ORDER — CHLORHEXIDINE GLUCONATE 4 % EX LIQD: 60.0000 mL | Freq: Once | CUTANEOUS | Status: DC

## 2014-01-15 MED ORDER — OXYCODONE HCL 5 MG PO TABS
5.0000 mg | ORAL_TABLET | Freq: Once | ORAL | Status: AC | PRN
Start: 2014-01-15 — End: 2014-01-15
  Administered 2014-01-15: 5 mg via ORAL

## 2014-01-15 MED ORDER — OXYCODONE-ACETAMINOPHEN 5-325 MG PO TABS: ORAL_TABLET | ORAL | Status: DC

## 2014-01-15 MED ORDER — MIDAZOLAM HCL 2 MG/2ML IJ SOLN: 1.0000 mg | INTRAMUSCULAR | Status: DC | PRN

## 2014-01-15 MED ORDER — FENTANYL CITRATE 0.05 MG/ML IJ SOLN
INTRAMUSCULAR | Status: AC
  Filled 2014-01-15: qty 4

## 2014-01-15 MED ORDER — CEFAZOLIN SODIUM-DEXTROSE 2-3 GM-% IV SOLR
INTRAVENOUS | Status: AC
  Filled 2014-01-15: qty 50

## 2014-01-15 MED ORDER — FENTANYL CITRATE 0.05 MG/ML IJ SOLN: 50.0000 ug | INTRAMUSCULAR | Status: DC | PRN

## 2014-01-15 MED ORDER — OXYCODONE HCL 5 MG/5ML PO SOLN: 5.0000 mg | Freq: Once | ORAL | Status: AC | PRN

## 2014-01-15 MED ORDER — PROPOFOL 10 MG/ML IV BOLUS
INTRAVENOUS | Status: AC
  Filled 2014-01-15: qty 20

## 2014-01-15 MED ORDER — HYDROMORPHONE HCL PF 1 MG/ML IJ SOLN
0.2500 mg | INTRAMUSCULAR | Status: DC | PRN
  Administered 2014-01-15 (×3): 0.5 mg via INTRAVENOUS

## 2014-01-15 MED ORDER — OXYCODONE HCL 5 MG PO TABS
ORAL_TABLET | ORAL | Status: AC
  Filled 2014-01-15: qty 1

## 2014-01-15 MED ORDER — PROPOFOL 10 MG/ML IV BOLUS
INTRAVENOUS | Status: DC | PRN
  Administered 2014-01-15: 200 mg via INTRAVENOUS

## 2014-01-15 MED ORDER — ONDANSETRON HCL 4 MG/2ML IJ SOLN
INTRAMUSCULAR | Status: DC | PRN
  Administered 2014-01-15: 4 mg via INTRAVENOUS

## 2014-01-15 MED ORDER — FENTANYL CITRATE 0.05 MG/ML IJ SOLN
INTRAMUSCULAR | Status: DC | PRN
  Administered 2014-01-15 (×2): 50 ug via INTRAVENOUS
  Administered 2014-01-15: 100 ug via INTRAVENOUS

## 2014-01-15 MED ORDER — SUCCINYLCHOLINE CHLORIDE 20 MG/ML IJ SOLN
INTRAMUSCULAR | Status: DC | PRN
  Administered 2014-01-15: 100 mg via INTRAVENOUS

## 2014-01-15 MED ORDER — DEXAMETHASONE SODIUM PHOSPHATE 4 MG/ML IJ SOLN
INTRAMUSCULAR | Status: DC | PRN
  Administered 2014-01-15: 10 mg via INTRAVENOUS

## 2014-01-15 MED ORDER — MIDAZOLAM HCL 2 MG/2ML IJ SOLN
INTRAMUSCULAR | Status: AC
  Filled 2014-01-15: qty 2

## 2014-01-15 SURGICAL SUPPLY — 63 items
BANDAGE ELASTIC 3 VELCRO ST LF (GAUZE/BANDAGES/DRESSINGS) IMPLANT
BIT DRILL 1.1 (BIT) ×2
BIT DRILL 1.1MM (BIT) ×1
BIT DRILL 60X20X1.1XQC TMX (BIT) IMPLANT
BIT DRL 60X20X1.1XQC TMX (BIT) ×1
BLADE MINI RND TIP GREEN BEAV (BLADE) IMPLANT
BLADE SURG 15 STRL LF DISP TIS (BLADE) ×2 IMPLANT
BLADE SURG 15 STRL SS (BLADE) ×6
BNDG CMPR 9X4 STRL LF SNTH (GAUZE/BANDAGES/DRESSINGS) ×1
BNDG ESMARK 4X9 LF (GAUZE/BANDAGES/DRESSINGS) ×3 IMPLANT
BNDG GAUZE ELAST 4 BULKY (GAUZE/BANDAGES/DRESSINGS) ×3 IMPLANT
CHLORAPREP W/TINT 26ML (MISCELLANEOUS) ×3 IMPLANT
CORDS BIPOLAR (ELECTRODE) ×3 IMPLANT
COVER MAYO STAND STRL (DRAPES) ×3 IMPLANT
COVER TABLE BACK 60X90 (DRAPES) ×3 IMPLANT
CUFF TOURNIQUET SINGLE 18IN (TOURNIQUET CUFF) ×3 IMPLANT
DRAPE EXTREMITY T 121X128X90 (DRAPE) ×3 IMPLANT
DRAPE OEC MINIVIEW 54X84 (DRAPES) ×3 IMPLANT
DRAPE SURG 17X23 STRL (DRAPES) ×3 IMPLANT
DRIVER BIT 1.5 (TRAUMA) ×2 IMPLANT
GAUZE SPONGE 4X4 12PLY STRL (GAUZE/BANDAGES/DRESSINGS) ×3 IMPLANT
GAUZE XEROFORM 1X8 LF (GAUZE/BANDAGES/DRESSINGS) ×3 IMPLANT
GLOVE BIO SURGEON STRL SZ7.5 (GLOVE) ×3 IMPLANT
GLOVE BIOGEL PI IND STRL 8 (GLOVE) ×1 IMPLANT
GLOVE BIOGEL PI IND STRL 8.5 (GLOVE) IMPLANT
GLOVE BIOGEL PI INDICATOR 8 (GLOVE) ×2
GLOVE BIOGEL PI INDICATOR 8.5 (GLOVE)
GLOVE SURG ORTHO 8.0 STRL STRW (GLOVE) IMPLANT
GOWN STRL REUS W/ TWL LRG LVL3 (GOWN DISPOSABLE) ×1 IMPLANT
GOWN STRL REUS W/ TWL XL LVL3 (GOWN DISPOSABLE) ×1 IMPLANT
GOWN STRL REUS W/TWL LRG LVL3 (GOWN DISPOSABLE) ×3
GOWN STRL REUS W/TWL XL LVL3 (GOWN DISPOSABLE) ×6 IMPLANT
NDL HYPO 25X1 1.5 SAFETY (NEEDLE) IMPLANT
NEEDLE HYPO 22GX1.5 SAFETY (NEEDLE) IMPLANT
NEEDLE HYPO 25X1 1.5 SAFETY (NEEDLE) IMPLANT
NS IRRIG 1000ML POUR BTL (IV SOLUTION) ×3 IMPLANT
PACK BASIN DAY SURGERY FS (CUSTOM PROCEDURE TRAY) ×3 IMPLANT
PAD CAST 3X4 CTTN HI CHSV (CAST SUPPLIES) IMPLANT
PAD CAST 4YDX4 CTTN HI CHSV (CAST SUPPLIES) ×1 IMPLANT
PADDING CAST ABS 4INX4YD NS (CAST SUPPLIES)
PADDING CAST ABS COTTON 4X4 ST (CAST SUPPLIES) IMPLANT
PADDING CAST COTTON 3X4 STRL (CAST SUPPLIES)
PADDING CAST COTTON 4X4 STRL (CAST SUPPLIES) ×3
PLATE STRAIGHT LOCK 1.5 (Plate) ×2 IMPLANT
SCREW L 1.5X12 (Screw) ×2 IMPLANT
SCREW NL 1.5X12 (Screw) ×10 IMPLANT
SCREW NONIOC 1.5 14M (Screw) ×4 IMPLANT
SLEEVE SCD COMPRESS KNEE MED (MISCELLANEOUS) IMPLANT
SPLINT PLASTER CAST XFAST 3X15 (CAST SUPPLIES) IMPLANT
SPLINT PLASTER CAST XFAST 4X15 (CAST SUPPLIES) IMPLANT
SPLINT PLASTER XTRA FAST SET 4 (CAST SUPPLIES)
SPLINT PLASTER XTRA FASTSET 3X (CAST SUPPLIES)
STOCKINETTE 4X48 STRL (DRAPES) ×3 IMPLANT
SUT ETHILON 3 0 PS 1 (SUTURE) IMPLANT
SUT ETHILON 4 0 PS 2 18 (SUTURE) ×3 IMPLANT
SUT MERSILENE 4 0 P 3 (SUTURE) IMPLANT
SUT VIC AB 3-0 PS1 18 (SUTURE)
SUT VIC AB 3-0 PS1 18XBRD (SUTURE) IMPLANT
SUT VICRYL 4-0 PS2 18IN ABS (SUTURE) ×3 IMPLANT
SYR BULB 3OZ (MISCELLANEOUS) ×3 IMPLANT
SYR CONTROL 10ML LL (SYRINGE) IMPLANT
TOWEL OR 17X24 6PK STRL BLUE (TOWEL DISPOSABLE) ×6 IMPLANT
UNDERPAD 30X30 INCONTINENT (UNDERPADS AND DIAPERS) ×3 IMPLANT

## 2014-01-15 NOTE — Op Note (Signed)
[id number] 

## 2014-01-15 NOTE — Anesthesia Preprocedure Evaluation (Signed)
Anesthesia Evaluation  Patient identified by MRN, date of birth, ID band Patient awake    Reviewed: Allergy & Precautions, H&P , NPO status , Patient's Chart, lab work & pertinent test results  Airway Mallampati: II  TM Distance: >3 FB Neck ROM: Full    Dental no notable dental hx. (+) Teeth Intact, Dental Advisory Given   Pulmonary asthma , Current Smoker,  breath sounds clear to auscultation  Pulmonary exam normal       Cardiovascular negative cardio ROS  Rhythm:Regular Rate:Normal     Neuro/Psych negative neurological ROS  negative psych ROS   GI/Hepatic negative GI ROS, Neg liver ROS,   Endo/Other  negative endocrine ROS  Renal/GU negative Renal ROS  negative genitourinary   Musculoskeletal   Abdominal   Peds  Hematology negative hematology ROS (+)   Anesthesia Other Findings   Reproductive/Obstetrics negative OB ROS                             Anesthesia Physical Anesthesia Plan  ASA: II  Anesthesia Plan: General   Post-op Pain Management:    Induction: Intravenous  Airway Management Planned: LMA  Additional Equipment:   Intra-op Plan:   Post-operative Plan: Extubation in OR  Informed Consent: I have reviewed the patients History and Physical, chart, labs and discussed the procedure including the risks, benefits and alternatives for the proposed anesthesia with the patient or authorized representative who has indicated his/her understanding and acceptance.   Dental advisory given  Plan Discussed with: CRNA  Anesthesia Plan Comments:         Anesthesia Quick Evaluation  
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
## 2014-01-15 NOTE — Transfer of Care (Signed)
Immediate Anesthesia Transfer of Care Note  Patient: Mark Lam  Procedure(s) Performed: Procedure(s): OPEN REDUCTION INTERNAL FIXATION (ORIF) LEFT RING FINGER METACARPAL FRACTURE (Left)  Patient Location: PACU  Anesthesia Type:General  Level of Consciousness: sedated and patient cooperative  Airway & Oxygen Therapy: Patient Spontanous Breathing and Patient connected to face mask oxygen  Post-op Assessment: Report given to PACU RN and Post -op Vital signs reviewed and stable  Post vital signs: Reviewed and stable  Complications: No apparent anesthesia complications
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
## 2014-01-15 NOTE — Anesthesia Procedure Notes (Signed)
Procedure Name: Intubation Date/Time: 01/15/2014 12:06 PM Performed by: Caren Macadam Pre-anesthesia Checklist: Patient identified, Emergency Drugs available, Suction available and Patient being monitored Patient Re-evaluated:Patient Re-evaluated prior to inductionOxygen Delivery Method: Circle System Utilized Preoxygenation: Pre-oxygenation with 100% oxygen Intubation Type: IV induction Ventilation: Mask ventilation without difficulty Laryngoscope Size: Miller and 2 Grade View: Grade I Tube type: Oral Tube size: 7.0 mm Number of attempts: 1 Airway Equipment and Method: stylet and oral airway Placement Confirmation: ETT inserted through vocal cords under direct vision,  positive ETCO2 and breath sounds checked- equal and bilateral Secured at: 21 cm Tube secured with: Tape Dental Injury: Teeth and Oropharynx as per pre-operative assessment  Comments: Intubated after pt vomited in OR.

## 2014-01-15 NOTE — Brief Op Note (Signed)
01/15/2014  1:22 PM  PATIENT:  Mark Lam  33 y.o. male  PRE-OPERATIVE DIAGNOSIS:  LEFT RING METACARPAL FRACTURE  POST-OPERATIVE DIAGNOSIS:  LEFT RING METACARPAL FRACTURE  PROCEDURE:  Procedure(s): OPEN REDUCTION INTERNAL FIXATION (ORIF) LEFT RING FINGER METACARPAL FRACTURE (Left)  SURGEON:  Surgeon(s) and Role:    * Betha Loa, MD - Primary    * Cindee Salt, MD - Assisting  PHYSICIAN ASSISTANT:   ASSISTANTS: Cindee Salt, MD   ANESTHESIA:   general  EBL:  Total I/O In: 500 [I.V.:500] Out: -   BLOOD ADMINISTERED:none  DRAINS: none   LOCAL MEDICATIONS USED:  MARCAINE     SPECIMEN:  No Specimen  DISPOSITION OF SPECIMEN:  N/A  COUNTS:  YES  TOURNIQUET:   Total Tourniquet Time Documented: Upper Arm (Left) - 58 minutes Total: Upper Arm (Left) - 58 minutes   DICTATION: .Other Dictation: Dictation Number [id number]  PLAN OF CARE: Discharge to home after PACU  PATIENT DISPOSITION:  PACU - hemodynamically stable.

## 2014-01-15 NOTE — Discharge Instructions (Addendum)

## 2014-01-15 NOTE — Anesthesia Postprocedure Evaluation (Signed)
  Anesthesia Post-op Note  Patient: Mark Lam  Procedure(s) Performed: Procedure(s): OPEN REDUCTION INTERNAL FIXATION (ORIF) LEFT RING FINGER METACARPAL FRACTURE (Left)  Patient Location: PACU  Anesthesia Type: General   Level of Consciousness: awake, alert  and oriented  Airway and Oxygen Therapy: Patient Spontanous Breathing  Post-op Pain: mild  Post-op Assessment: Post-op Vital signs reviewed  Post-op Vital Signs: Reviewed  Last Vitals:  Filed Vitals:   01/15/14 1452  BP: 132/79  Pulse: 60  Temp: 36.7 C  Resp: 18    Complications: No apparent anesthesia complications

## 2014-01-15 NOTE — Op Note (Signed)
Intra-operative fluoroscopic images in the AP, lateral, and oblique views were taken and evaluated by myself.  Reduction and hardware placement were confirmed.  There was no intraarticular penetration of permanent hardware.  

## 2014-01-17 ENCOUNTER — Ambulatory Visit: Payer: Self-pay | Attending: Orthopedic Surgery | Admitting: Occupational Therapy

## 2014-01-17 ENCOUNTER — Encounter (HOSPITAL_BASED_OUTPATIENT_CLINIC_OR_DEPARTMENT_OTHER): Payer: Self-pay | Admitting: Orthopedic Surgery

## 2014-01-17 DIAGNOSIS — M25549 Pain in joints of unspecified hand: Secondary | ICD-10-CM | POA: Insufficient documentation

## 2014-01-17 DIAGNOSIS — IMO0001 Reserved for inherently not codable concepts without codable children: Secondary | ICD-10-CM | POA: Insufficient documentation

## 2014-01-17 DIAGNOSIS — M25649 Stiffness of unspecified hand, not elsewhere classified: Secondary | ICD-10-CM | POA: Insufficient documentation

## 2014-01-24 ENCOUNTER — Ambulatory Visit: Payer: Self-pay | Attending: Orthopedic Surgery | Admitting: Occupational Therapy

## 2014-01-24 DIAGNOSIS — M25549 Pain in joints of unspecified hand: Secondary | ICD-10-CM | POA: Insufficient documentation

## 2014-01-24 DIAGNOSIS — M25649 Stiffness of unspecified hand, not elsewhere classified: Secondary | ICD-10-CM | POA: Insufficient documentation

## 2014-01-24 DIAGNOSIS — IMO0001 Reserved for inherently not codable concepts without codable children: Secondary | ICD-10-CM | POA: Insufficient documentation

## 2014-01-31 ENCOUNTER — Other Ambulatory Visit: Payer: Self-pay | Admitting: Orthopedic Surgery

## 2014-02-01 ENCOUNTER — Encounter (HOSPITAL_BASED_OUTPATIENT_CLINIC_OR_DEPARTMENT_OTHER): Payer: Self-pay | Admitting: *Deleted

## 2014-02-05 ENCOUNTER — Ambulatory Visit: Payer: Self-pay | Admitting: Occupational Therapy

## 2014-02-07 ENCOUNTER — Encounter (HOSPITAL_BASED_OUTPATIENT_CLINIC_OR_DEPARTMENT_OTHER): Payer: Self-pay | Admitting: Anesthesiology

## 2014-02-07 ENCOUNTER — Ambulatory Visit (HOSPITAL_BASED_OUTPATIENT_CLINIC_OR_DEPARTMENT_OTHER): Payer: Self-pay | Admitting: Anesthesiology

## 2014-02-07 ENCOUNTER — Ambulatory Visit (HOSPITAL_BASED_OUTPATIENT_CLINIC_OR_DEPARTMENT_OTHER)
Admission: RE | Admit: 2014-02-07 | Discharge: 2014-02-07 | Disposition: A | Discharge status: Home / Self Care | Payer: Self-pay | Source: Ambulatory Visit | Attending: Orthopedic Surgery | Admitting: Orthopedic Surgery

## 2014-02-07 ENCOUNTER — Encounter (HOSPITAL_BASED_OUTPATIENT_CLINIC_OR_DEPARTMENT_OTHER)
Admission: RE | Disposition: A | Discharge status: Home / Self Care | Payer: Self-pay | Source: Ambulatory Visit | Attending: Orthopedic Surgery

## 2014-02-07 DIAGNOSIS — X58XXXA Exposure to other specified factors, initial encounter: Secondary | ICD-10-CM | POA: Insufficient documentation

## 2014-02-07 DIAGNOSIS — S62309A Unspecified fracture of unspecified metacarpal bone, initial encounter for closed fracture: Secondary | ICD-10-CM | POA: Insufficient documentation

## 2014-02-07 DIAGNOSIS — J45909 Unspecified asthma, uncomplicated: Secondary | ICD-10-CM | POA: Insufficient documentation

## 2014-02-07 DIAGNOSIS — G43909 Migraine, unspecified, not intractable, without status migrainosus: Secondary | ICD-10-CM | POA: Insufficient documentation

## 2014-02-07 HISTORY — DX: Chronic cough: R05.3

## 2014-02-07 HISTORY — DX: Migraine, unspecified, not intractable, without status migrainosus: G43.909

## 2014-02-07 HISTORY — DX: Family history of other specified conditions: Z84.89

## 2014-02-07 HISTORY — DX: Unspecified fracture of unspecified metacarpal bone, initial encounter for closed fracture: S62.309A

## 2014-02-07 HISTORY — DX: Cough: R05

## 2014-02-07 HISTORY — PX: OPEN REDUCTION INTERNAL FIXATION (ORIF) METACARPAL: SHX6234

## 2014-02-07 LAB — POCT HEMOGLOBIN-HEMACUE: Hemoglobin: 14.4 g/dL (ref 13.0–17.0)

## 2014-02-07 SURGERY — OPEN REDUCTION INTERNAL FIXATION (ORIF) METACARPAL
Anesthesia: General | Site: Hand | Laterality: Left

## 2014-02-07 MED ORDER — CHLORHEXIDINE GLUCONATE 4 % EX LIQD: 60.0000 mL | Freq: Once | CUTANEOUS | Status: DC

## 2014-02-07 MED ORDER — MIDAZOLAM HCL 2 MG/2ML IJ SOLN: 1.0000 mg | INTRAMUSCULAR | Status: DC | PRN

## 2014-02-07 MED ORDER — LACTATED RINGERS IV SOLN
INTRAVENOUS | Status: DC
  Administered 2014-02-07 (×2): via INTRAVENOUS

## 2014-02-07 MED ORDER — ONDANSETRON HCL 4 MG/2ML IJ SOLN: 4.0000 mg | Freq: Once | INTRAMUSCULAR | Status: DC | PRN

## 2014-02-07 MED ORDER — FENTANYL CITRATE 0.05 MG/ML IJ SOLN
INTRAMUSCULAR | Status: AC
Start: 2014-02-07 — End: 2014-02-07
  Filled 2014-02-07: qty 6

## 2014-02-07 MED ORDER — OXYCODONE-ACETAMINOPHEN 10-325 MG PO TABS
1.0000 | ORAL_TABLET | Freq: Four times a day (QID) | ORAL | Status: DC | PRN
Start: 2014-02-07 — End: 2015-03-24

## 2014-02-07 MED ORDER — MIDAZOLAM HCL 2 MG/2ML IJ SOLN
INTRAMUSCULAR | Status: AC
  Filled 2014-02-07: qty 2

## 2014-02-07 MED ORDER — MIDAZOLAM HCL 5 MG/5ML IJ SOLN
INTRAMUSCULAR | Status: DC | PRN
  Administered 2014-02-07: 2 mg via INTRAVENOUS

## 2014-02-07 MED ORDER — OXYCODONE HCL 5 MG/5ML PO SOLN: 5.0000 mg | Freq: Once | ORAL | Status: AC | PRN

## 2014-02-07 MED ORDER — OXYCODONE HCL 5 MG PO TABS
5.0000 mg | ORAL_TABLET | Freq: Once | ORAL | Status: AC | PRN
  Administered 2014-02-07: 5 mg via ORAL

## 2014-02-07 MED ORDER — PROPOFOL 10 MG/ML IV BOLUS
INTRAVENOUS | Status: DC | PRN
  Administered 2014-02-07: 200 mg via INTRAVENOUS

## 2014-02-07 MED ORDER — LIDOCAINE HCL (CARDIAC) 20 MG/ML IV SOLN
INTRAVENOUS | Status: DC | PRN
  Administered 2014-02-07: 50 mg via INTRAVENOUS

## 2014-02-07 MED ORDER — MIDAZOLAM HCL 2 MG/ML PO SYRP: 12.0000 mg | ORAL_SOLUTION | Freq: Once | ORAL | Status: DC | PRN

## 2014-02-07 MED ORDER — OXYCODONE-ACETAMINOPHEN 5-325 MG PO TABS: ORAL_TABLET | ORAL | Status: DC

## 2014-02-07 MED ORDER — ONDANSETRON HCL 4 MG/2ML IJ SOLN
INTRAMUSCULAR | Status: DC | PRN
  Administered 2014-02-07: 4 mg via INTRAVENOUS

## 2014-02-07 MED ORDER — HYDROMORPHONE HCL 1 MG/ML IJ SOLN
INTRAMUSCULAR | Status: AC
  Filled 2014-02-07: qty 1

## 2014-02-07 MED ORDER — OXYCODONE HCL 5 MG PO TABS
ORAL_TABLET | ORAL | Status: AC
  Filled 2014-02-07: qty 1

## 2014-02-07 MED ORDER — FENTANYL CITRATE 0.05 MG/ML IJ SOLN: 50.0000 ug | INTRAMUSCULAR | Status: DC | PRN

## 2014-02-07 MED ORDER — FENTANYL CITRATE 0.05 MG/ML IJ SOLN
INTRAMUSCULAR | Status: DC | PRN
  Administered 2014-02-07 (×2): 25 ug via INTRAVENOUS
  Administered 2014-02-07 (×2): 50 ug via INTRAVENOUS

## 2014-02-07 MED ORDER — BUPIVACAINE HCL (PF) 0.25 % IJ SOLN
INTRAMUSCULAR | Status: DC | PRN
  Administered 2014-02-07: 10 mL

## 2014-02-07 MED ORDER — BUPIVACAINE HCL (PF) 0.25 % IJ SOLN
INTRAMUSCULAR | Status: AC
  Filled 2014-02-07: qty 30

## 2014-02-07 MED ORDER — CEFAZOLIN SODIUM-DEXTROSE 2-3 GM-% IV SOLR: 2.0000 g | INTRAVENOUS | Status: DC

## 2014-02-07 MED ORDER — CEFAZOLIN SODIUM-DEXTROSE 2-3 GM-% IV SOLR
INTRAVENOUS | Status: AC
  Filled 2014-02-07: qty 50

## 2014-02-07 MED ORDER — HYDROMORPHONE HCL 1 MG/ML IJ SOLN
0.2500 mg | INTRAMUSCULAR | Status: DC | PRN
  Administered 2014-02-07 (×3): 0.5 mg via INTRAVENOUS

## 2014-02-07 MED ORDER — DEXAMETHASONE SODIUM PHOSPHATE 10 MG/ML IJ SOLN
INTRAMUSCULAR | Status: DC | PRN
  Administered 2014-02-07: 10 mg via INTRAVENOUS

## 2014-02-07 SURGICAL SUPPLY — 63 items
BANDAGE ELASTIC 3 VELCRO ST LF (GAUZE/BANDAGES/DRESSINGS) IMPLANT
BIT DRILL 1.1 (BIT) ×2
BIT DRILL 1.1MM (BIT) ×1
BIT DRILL 60X20X1.1XQC TMX (BIT) IMPLANT
BIT DRL 60X20X1.1XQC TMX (BIT) ×1
BLADE MINI RND TIP GREEN BEAV (BLADE) IMPLANT
BLADE SURG 15 STRL LF DISP TIS (BLADE) ×2 IMPLANT
BLADE SURG 15 STRL SS (BLADE) ×6
BNDG CMPR 9X4 STRL LF SNTH (GAUZE/BANDAGES/DRESSINGS) ×1
BNDG ESMARK 4X9 LF (GAUZE/BANDAGES/DRESSINGS) ×3 IMPLANT
BNDG GAUZE ELAST 4 BULKY (GAUZE/BANDAGES/DRESSINGS) ×3 IMPLANT
CHLORAPREP W/TINT 26ML (MISCELLANEOUS) ×3 IMPLANT
CORDS BIPOLAR (ELECTRODE) ×3 IMPLANT
COVER MAYO STAND STRL (DRAPES) ×3 IMPLANT
COVER TABLE BACK 60X90 (DRAPES) ×3 IMPLANT
CUFF TOURNIQUET SINGLE 18IN (TOURNIQUET CUFF) ×3 IMPLANT
DRAPE EXTREMITY TIBURON (DRAPES) ×3 IMPLANT
DRAPE OEC MINIVIEW 54X84 (DRAPES) ×3 IMPLANT
DRAPE SURG 17X23 STRL (DRAPES) ×3 IMPLANT
GAUZE SPONGE 4X4 12PLY STRL (GAUZE/BANDAGES/DRESSINGS) ×3 IMPLANT
GAUZE XEROFORM 1X8 LF (GAUZE/BANDAGES/DRESSINGS) ×3 IMPLANT
GLOVE BIO SURGEON STRL SZ7.5 (GLOVE) ×3 IMPLANT
GLOVE BIOGEL PI IND STRL 8 (GLOVE) ×1 IMPLANT
GLOVE BIOGEL PI IND STRL 8.5 (GLOVE) IMPLANT
GLOVE BIOGEL PI INDICATOR 8 (GLOVE) ×2
GLOVE BIOGEL PI INDICATOR 8.5 (GLOVE)
GLOVE SURG ORTHO 8.0 STRL STRW (GLOVE) IMPLANT
GOWN STRL REUS W/ TWL LRG LVL3 (GOWN DISPOSABLE) ×1 IMPLANT
GOWN STRL REUS W/ TWL XL LVL3 (GOWN DISPOSABLE) ×1 IMPLANT
GOWN STRL REUS W/TWL LRG LVL3 (GOWN DISPOSABLE) ×3
GOWN STRL REUS W/TWL XL LVL3 (GOWN DISPOSABLE) ×6 IMPLANT
K-WIRE DBL TRONS .035X6 (WIRE) ×3
KWIRE DBL TRONS .035X6 (WIRE) IMPLANT
NDL HYPO 25X1 1.5 SAFETY (NEEDLE) IMPLANT
NEEDLE HYPO 22GX1.5 SAFETY (NEEDLE) IMPLANT
NEEDLE HYPO 25X1 1.5 SAFETY (NEEDLE) IMPLANT
NS IRRIG 1000ML POUR BTL (IV SOLUTION) ×3 IMPLANT
PACK BASIN DAY SURGERY FS (CUSTOM PROCEDURE TRAY) ×3 IMPLANT
PAD CAST 3X4 CTTN HI CHSV (CAST SUPPLIES) IMPLANT
PAD CAST 4YDX4 CTTN HI CHSV (CAST SUPPLIES) ×1 IMPLANT
PADDING CAST ABS 4INX4YD NS (CAST SUPPLIES)
PADDING CAST ABS COTTON 4X4 ST (CAST SUPPLIES) IMPLANT
PADDING CAST COTTON 3X4 STRL (CAST SUPPLIES)
PADDING CAST COTTON 4X4 STRL (CAST SUPPLIES) ×3
PLATE STRAIGHT LOCK 1.5 (Plate) ×2 IMPLANT
SCREW NL 1.5X12 (Screw) ×10 IMPLANT
SCREW NONIOC 1.5 14M (Screw) ×4 IMPLANT
SLEEVE SCD COMPRESS KNEE MED (MISCELLANEOUS) IMPLANT
SPLINT PLASTER CAST XFAST 3X15 (CAST SUPPLIES) IMPLANT
SPLINT PLASTER CAST XFAST 4X15 (CAST SUPPLIES) IMPLANT
SPLINT PLASTER XTRA FAST SET 4 (CAST SUPPLIES)
SPLINT PLASTER XTRA FASTSET 3X (CAST SUPPLIES)
STOCKINETTE 4X48 STRL (DRAPES) ×3 IMPLANT
SUT ETHILON 3 0 PS 1 (SUTURE) IMPLANT
SUT ETHILON 4 0 PS 2 18 (SUTURE) ×3 IMPLANT
SUT MERSILENE 4 0 P 3 (SUTURE) IMPLANT
SUT VIC AB 3-0 PS1 18 (SUTURE)
SUT VIC AB 3-0 PS1 18XBRD (SUTURE) IMPLANT
SUT VICRYL 4-0 PS2 18IN ABS (SUTURE) ×3 IMPLANT
SYR BULB 3OZ (MISCELLANEOUS) ×3 IMPLANT
SYR CONTROL 10ML LL (SYRINGE) IMPLANT
TOWEL OR 17X24 6PK STRL BLUE (TOWEL DISPOSABLE) ×6 IMPLANT
UNDERPAD 30X30 INCONTINENT (UNDERPADS AND DIAPERS) ×3 IMPLANT

## 2014-02-07 NOTE — Brief Op Note (Signed)
02/07/2014  12:04 PM  PATIENT:  Mark Lam  33 y.o. male  PRE-OPERATIVE DIAGNOSIS:  LEFT RING FINGER METACARPAL  POST-OPERATIVE DIAGNOSIS:  left ring finger metacarpal  PROCEDURE:  Procedure(s): OPEN REDUCTION INTERNAL FIXATION (ORIF)  LEFT RING FINGER METACARPAL (Left)  SURGEON:  Surgeon(s) and Role:    * Betha Loa, MD - Primary  PHYSICIAN ASSISTANT:   ASSISTANTS: Annye Rusk, PA   ANESTHESIA:   general  EBL:  Total I/O In: 1400 [I.V.:1400] Out: -   BLOOD ADMINISTERED:none  DRAINS: none   LOCAL MEDICATIONS USED:  MARCAINE     SPECIMEN:  No Specimen  DISPOSITION OF SPECIMEN:  N/A  COUNTS:  YES  TOURNIQUET:   Total Tourniquet Time Documented: Upper Arm (Left) - 60 minutes Total: Upper Arm (Left) - 60 minutes   DICTATION: .Other Dictation: Dictation Number [id number]  PLAN OF CARE: Discharge to home after PACU  PATIENT DISPOSITION:  PACU - hemodynamically stable.

## 2014-02-07 NOTE — Op Note (Signed)
Intra-operative fluoroscopic images in the AP, lateral, and oblique views were taken and evaluated by myself.  Reduction and hardware placement were confirmed.  There was no intraarticular penetration of permanent hardware.  

## 2014-02-07 NOTE — Anesthesia Postprocedure Evaluation (Signed)
  Anesthesia Post-op Note  Patient: Mark Lam  Procedure(s) Performed: Procedure(s): OPEN REDUCTION INTERNAL FIXATION (ORIF)  LEFT RING FINGER METACARPAL (Left)  Patient Location: PACU  Anesthesia Type: General   Level of Consciousness: awake, alert  and oriented  Airway and Oxygen Therapy: Patient Spontanous Breathing  Post-op Pain: mild  Post-op Assessment: Post-op Vital signs reviewed  Post-op Vital Signs: Reviewed  Last Vitals:  Filed Vitals:   02/07/14 1354  BP:   Pulse: 52  Temp: 36.4 C  Resp: 18    Complications: No apparent anesthesia complications

## 2014-02-07 NOTE — Op Note (Signed)
NAME:  Spainhower, Nahome                  ACCOUNT NO.:  [id number]  MEDICAL RECORD NO.:  [id number]  LOCATION:                                 FACILITY:  PHYSICIAN:  Betha Loa, MD        DATE OF BIRTH:  1980-10-17  DATE OF PROCEDURE:  02/07/2014 DATE OF DISCHARGE:                              OPERATIVE REPORT   PREOPERATIVE DIAGNOSIS:  Left ring finger metacarpal fracture with rotation.  POSTOPERATIVE DIAGNOSIS:  Left ring finger metacarpal fracture with rotation.  PROCEDURE:  Repeat open reduction and internal fixation of left ring finger metacarpal fracture.  SURGEON:  Betha Loa, MD.  ASSISTANT:  Annye Rusk, PA.  ANESTHESIA:  General.  IV FLUIDS:  Per Anesthesia flow sheet.  ESTIMATED BLOOD LOSS:  Minimal.  COMPLICATIONS:  None.  SPECIMENS:  None.  TOURNIQUET TIME:  60 minutes.  DISPOSITION:  Stable to PACU.  INDICATIONS:  Mr. Testerman is a 33 year old male who underwent open reduction and internal fixation of a ring finger metacarpal fracture on the left hand approximately 3 weeks ago.  Postoperatively, was noted to have rotation of the finger with scissoring of the ring under the small finger.  He wished to have this revised.  Risks, benefits, and alternatives of the surgery were discussed including risk of blood loss, infection, damage to nerves, vessels, tendons, ligaments, bone; failure of surgery; need for additional surgery, complications with wound healing, continued pain, and continued rotation.  He voiced understanding of these risks and elected to proceed.  OPERATIVE COURSE:  After being identified preoperatively by myself, the patient and I agreed upon procedure and site of procedure.  Surgical site was marked.  The risks, benefits, and alternatives of surgery were reviewed and he wished to proceed.  Surgical consent had been signed. He was given IV Ancef as preoperative antibiotic prophylaxis.  He was transferred to the operating room and placed on  the operating room table in supine position with the left upper extremity on arm board.  General anesthesia was induced by anesthesiologist.  The left upper extremity was prepped and draped in normal sterile orthopedic fashion.  A surgical pause was performed between surgeons, Anesthesia, operating staff, and all were in agreement as to the patient, procedure, and site of procedure.  Tourniquet at the proximal aspect of the extremity was inflated to 250 mmHg after exsanguination of the limb with an Esmarch bandage.  Previous incision was followed, carried to the subcutaneous tissues by spreading technique.  Care was taken to protect all nerve branches.  Periosteum was incised.  Freer elevator was used to elevate the periosteum.  The plate was easily identified.  It was removed.  The fracture was able to be re-reduced.  A plate from the ALPS set was selected.  It was cut to be an 8-hole plate.  It was then secured to the bone distally using the same screw holes.  One additional screw hole was achieved distally.  The fracture was re-reduced.  The plate was secured proximally with the pins.  The wrist was placed through a tenodesis and there was no scissoring of the digits. With the digits extended, they all lined  up appropriately.  The proximal portion of the plate was then filled with nonlocking screws.  All nonlocking screws were used. Standard AO drilling and measuring technique was used throughout.  Good purchase was obtained in all but the 4th most proximal hole which had adequate purchase.  The wrist was again placed through a tenodesis and there was no scissoring.  The C-arm was used in AP, lateral, and oblique projections throughout the case to aid in reduction and position of hardware.  The wound was copiously irrigated with sterile saline.  The periosteum was closed with 4-0 Vicryl in a running fashion.  The skin was closed with 4-0 nylon in a horizontal mattress fashion.  The  wound was injected with 10 mL of 0.25% plain Marcaine to aid in postoperative analgesia.  It was then dressed with sterile Xeroform, 4x4s, and wrapped with a Kerlix bandage.  The volar and dorsal slab splint including the long, ring, and small fingers was placed with the MPs flexed and the IPs extended.  This was wrapped with Kerlix and Ace bandage.  Tourniquet was deflated at 60 minutes.  Fingertips were pink with brisk capillary refill after deflation of tourniquet.  Operative drapes were broken down.  The patient was awoken from anesthesia safely.  He was transferred back to stretcher and taken to PACU in stable condition.  I will see him back in the office in 1 week for postoperative followup.  I will give him Percocet 5/325, 1-2 p.o. q.6 hours p.r.n. pain, dispensed #40.     Betha Loa, MD     KK/MEDQ  D:  02/07/2014  T:  02/07/2014  Job:  [id number]

## 2014-02-07 NOTE — Anesthesia Procedure Notes (Signed)
Procedure Name: LMA Insertion Date/Time: 02/07/2014 10:48 AM Performed by: Genevieve Norlander L Pre-anesthesia Checklist: Patient identified, Emergency Drugs available, Suction available and Patient being monitored Patient Re-evaluated:Patient Re-evaluated prior to inductionOxygen Delivery Method: Circle System Utilized Preoxygenation: Pre-oxygenation with 100% oxygen Intubation Type: IV induction Ventilation: Mask ventilation without difficulty LMA: LMA inserted LMA Size: 5.0 Number of attempts: 1 Airway Equipment and Method: bite block Placement Confirmation: positive ETCO2 and breath sounds checked- equal and bilateral Tube secured with: Tape Dental Injury: Teeth and Oropharynx as per pre-operative assessment

## 2014-02-07 NOTE — H&P (Signed)
Mark Lam is an 33 y.o. male.   Chief Complaint: left hand metacarpal fracture HPI: 33 yo male underwent ORIF left ring finger metacarpal fracture 3 weeks ago.  Noted post operatively to have continued scissoring of ring under small finger.  This is bothersome to him.  He wishes to undergo repeat ORIF to try to correct this.  Past Medical History  Diagnosis Date  . Asthma     prn inhaler  . Migraines   . Metacarpal bone fracture 12/2013    left ring  . Chronic cough     states due to asthma  . Family history of anesthesia complication     states mother is hard to wake up post-op    Past Surgical History  Procedure Laterality Date  . Knee surgery Right   . Open reduction internal fixation (orif) metacarpal Left 01/15/2014    Procedure: OPEN REDUCTION INTERNAL FIXATION (ORIF) LEFT RING FINGER METACARPAL FRACTURE;  Surgeon: Betha Loa, MD;  Location: [facility];  Service: Orthopedics;  Laterality: Left;    Family History  Problem Relation Age of Onset  . Hypertension Mother   . Diabetes Mother   . Cancer Mother   . Anesthesia problems Mother     hard to wake up post-op  . Cancer Father   . Diabetes Father   . Hypertension Father    Social History:  reports that he has been smoking Cigarettes.  He has been smoking about 0.00 packs per day for the past 10 years. He has never used smokeless tobacco. He reports that he drinks alcohol. He reports that he does not use illicit drugs.  Allergies:  Allergies  Allergen Reactions  . Bactrim Swelling  . Coconut Oil Swelling  . Pork-Derived Products Nausea And Vomiting  . Soap Itching    DIAL SOAP    Medications Prior to Admission  Medication Sig Dispense Refill  . albuterol (PROVENTIL HFA;VENTOLIN HFA) 108 (90 BASE) MCG/ACT inhaler Inhale 1 puff into the lungs every 6 (six) hours as needed for wheezing or shortness of breath.  1 Inhaler  0  . oxyCODONE-acetaminophen (PERCOCET/ROXICET) 5-325 MG per tablet Take by  mouth every 4 (four) hours as needed for severe pain.        Results for orders placed during the hospital encounter of 02/07/14 (from the past 48 hour(s))  POCT HEMOGLOBIN-HEMACUE     Status: None   Collection Time    02/07/14  8:53 AM      Result Value Ref Range   Hemoglobin 14.4  13.0 - 17.0 g/dL    No results found.   A comprehensive review of systems was negative except for: Eyes: positive for contacts/glasses Respiratory: positive for cough Behavioral/Psych: positive for depression  Blood pressure 121/75, pulse 76, temperature 98 F (36.7 C), temperature source Oral, resp. rate 16, height  (1.778 m), weight 83.915 kg (185 lb), SpO2 99.00%.  General appearance: alert, cooperative and appears stated age Head: Normocephalic, without obvious abnormality, atraumatic Neck: supple, symmetrical, trachea midline Resp: clear to auscultation bilaterally Cardio: regular rate and rhythm GI: non tender Extremities: intact sensation and capillary refill all digits.  +epl/fpl/io.  healed wound left hand.  ring finger scissors under small ~ 50% and deviates toward small finger.   Pulses: 2+ and symmetric Skin: Skin color, texture, turgor normal. No rashes or lesions Neurologic: Grossly normal Incision/Wound: Healed wound  Assessment/Plan Left ring finger metacarpal fracture with rotation.  Non operative and operative treatment options were  discussed with the patient and patient wishes to proceed with operative treatment. Plan repeat ORIF. Risks, benefits, and alternatives of surgery were discussed and the patient agrees with the plan of care.   Javius Sylla R 02/07/2014, 10:28 AM

## 2014-02-07 NOTE — Transfer of Care (Signed)
Immediate Anesthesia Transfer of Care Note  Patient: Mark Lam  Procedure(s) Performed: Procedure(s): OPEN REDUCTION INTERNAL FIXATION (ORIF)  LEFT RING FINGER METACARPAL (Left)  Patient Location: PACU  Anesthesia Type:General  Level of Consciousness: sedated and patient cooperative  Airway & Oxygen Therapy: Patient Spontanous Breathing  Post-op Assessment: Report given to PACU RN and Post -op Vital signs reviewed and stable  Post vital signs: Reviewed and stable  Complications: No apparent anesthesia complications

## 2014-02-07 NOTE — Anesthesia Preprocedure Evaluation (Signed)
Anesthesia Evaluation  Patient identified by MRN, date of birth, ID band Patient awake    Reviewed: Allergy & Precautions, H&P , NPO status , Patient's Chart, lab work & pertinent test results  Airway Mallampati: I TM Distance: >3 FB Neck ROM: Full    Dental  (+) Teeth Intact, Dental Advisory Given   Pulmonary asthma , Current Smoker,  breath sounds clear to auscultation        Cardiovascular Rhythm:Regular Rate:Normal     Neuro/Psych    GI/Hepatic   Endo/Other    Renal/GU      Musculoskeletal   Abdominal   Peds  Hematology   Anesthesia Other Findings   Reproductive/Obstetrics                           Anesthesia Physical Anesthesia Plan  ASA: II  Anesthesia Plan: General   Post-op Pain Management:    Induction: Intravenous  Airway Management Planned: LMA  Additional Equipment:   Intra-op Plan:   Post-operative Plan: Extubation in OR  Informed Consent: I have reviewed the patients History and Physical, chart, labs and discussed the procedure including the risks, benefits and alternatives for the proposed anesthesia with the patient or authorized representative who has indicated his/her understanding and acceptance.   Dental advisory given  Plan Discussed with: CRNA, Anesthesiologist and Surgeon  Anesthesia Plan Comments:         Anesthesia Quick Evaluation  

## 2014-02-07 NOTE — Discharge Instructions (Addendum)

## 2014-02-07 NOTE — Op Note (Signed)
[id number] 

## 2014-02-08 ENCOUNTER — Encounter (HOSPITAL_BASED_OUTPATIENT_CLINIC_OR_DEPARTMENT_OTHER): Payer: Self-pay | Admitting: Orthopedic Surgery

## 2014-02-21 ENCOUNTER — Ambulatory Visit: Payer: Self-pay | Attending: Orthopedic Surgery | Admitting: Occupational Therapy

## 2014-02-21 DIAGNOSIS — M79645 Pain in left finger(s): Secondary | ICD-10-CM | POA: Insufficient documentation

## 2014-02-21 DIAGNOSIS — Z5189 Encounter for other specified aftercare: Secondary | ICD-10-CM | POA: Insufficient documentation

## 2014-02-21 DIAGNOSIS — M25642 Stiffness of left hand, not elsewhere classified: Secondary | ICD-10-CM | POA: Insufficient documentation

## 2014-03-05 ENCOUNTER — Ambulatory Visit: Payer: Self-pay | Admitting: Occupational Therapy

## 2014-03-11 ENCOUNTER — Encounter: Payer: Self-pay | Admitting: Occupational Therapy

## 2014-03-22 ENCOUNTER — Ambulatory Visit: Payer: Self-pay | Admitting: Occupational Therapy

## 2014-03-29 ENCOUNTER — Ambulatory Visit: Payer: Self-pay | Attending: Orthopedic Surgery | Admitting: Occupational Therapy

## 2014-03-29 DIAGNOSIS — M79645 Pain in left finger(s): Secondary | ICD-10-CM | POA: Insufficient documentation

## 2014-03-29 DIAGNOSIS — Z5189 Encounter for other specified aftercare: Secondary | ICD-10-CM | POA: Insufficient documentation

## 2014-03-29 DIAGNOSIS — M25642 Stiffness of left hand, not elsewhere classified: Secondary | ICD-10-CM | POA: Insufficient documentation

## 2014-04-05 ENCOUNTER — Encounter: Payer: Self-pay | Admitting: Occupational Therapy

## 2014-05-02 ENCOUNTER — Emergency Department (HOSPITAL_COMMUNITY)
Admission: EM | Admit: 2014-05-02 | Discharge: 2014-05-02 | Disposition: A | Discharge status: Home / Self Care | Payer: Self-pay | Attending: Emergency Medicine | Admitting: Emergency Medicine

## 2014-05-02 ENCOUNTER — Encounter (HOSPITAL_COMMUNITY): Payer: Self-pay | Admitting: Emergency Medicine

## 2014-05-02 DIAGNOSIS — Z79891 Long term (current) use of opiate analgesic: Secondary | ICD-10-CM | POA: Insufficient documentation

## 2014-05-02 DIAGNOSIS — Z8679 Personal history of other diseases of the circulatory system: Secondary | ICD-10-CM | POA: Insufficient documentation

## 2014-05-02 DIAGNOSIS — Z720 Tobacco use: Secondary | ICD-10-CM | POA: Insufficient documentation

## 2014-05-02 DIAGNOSIS — J45909 Unspecified asthma, uncomplicated: Secondary | ICD-10-CM | POA: Insufficient documentation

## 2014-05-02 DIAGNOSIS — Z8781 Personal history of (healed) traumatic fracture: Secondary | ICD-10-CM | POA: Insufficient documentation

## 2014-05-02 DIAGNOSIS — L509 Urticaria, unspecified: Secondary | ICD-10-CM | POA: Insufficient documentation

## 2014-05-02 DIAGNOSIS — Z79899 Other long term (current) drug therapy: Secondary | ICD-10-CM | POA: Insufficient documentation

## 2014-05-02 MED ORDER — DIPHENHYDRAMINE HCL 25 MG PO CAPS
25.0000 mg | ORAL_CAPSULE | Freq: Once | ORAL | Status: AC
  Administered 2014-05-02: 25 mg via ORAL
  Filled 2014-05-02: qty 1

## 2014-05-02 MED ORDER — FAMOTIDINE 20 MG PO TABS: 20.0000 mg | ORAL_TABLET | Freq: Two times a day (BID) | ORAL | Status: AC

## 2014-05-02 MED ORDER — FAMOTIDINE 20 MG PO TABS
20.0000 mg | ORAL_TABLET | Freq: Once | ORAL | Status: AC
  Administered 2014-05-02: 20 mg via ORAL
  Filled 2014-05-02: qty 1

## 2014-05-02 MED ORDER — DIPHENHYDRAMINE HCL 25 MG PO TABS: 25.0000 mg | ORAL_TABLET | Freq: Four times a day (QID) | ORAL | Status: DC

## 2014-05-02 MED ORDER — PREDNISONE 20 MG PO TABS
60.0000 mg | ORAL_TABLET | Freq: Once | ORAL | Status: AC
  Administered 2014-05-02: 60 mg via ORAL
  Filled 2014-05-02: qty 3

## 2014-05-02 MED ORDER — PREDNISONE 10 MG PO TABS
40.0000 mg | ORAL_TABLET | Freq: Every day | ORAL | Status: DC
Start: 2014-05-02 — End: 2015-03-24

## 2014-05-02 NOTE — ED Notes (Signed)
Pt has hives over arms/trunk/perineal area --- ate shrimp/chicken from [employer] . No resp distress.

## 2014-05-02 NOTE — Discharge Instructions (Signed)
Return for any lip swelling tongue swelling or difficulty breathing. Otherwise take the medication as directed. Take the Benadryl every 6 hours for the next 2 days. Take the Pepcid for the next week. Take prednisone for the next 5 days. Return for any new or worse symptoms. Also recommend avoiding shrimp over the next several weeks. As we discussed it may not been an allergic reaction to shrimp but that is a common cause.

## 2014-05-02 NOTE — ED Provider Notes (Signed)
CSN: [id number]     Arrival date & time 05/02/14  1028 History   First MD Initiated Contact with Patient 05/02/14 1106     Chief Complaint  Patient presents with  . Allergic Reaction     (Consider location/radiation/quality/duration/timing/severity/associated sxs/prior Treatment) Patient is a 33 y.o. male presenting with allergic reaction. The history is provided by the patient.  Allergic Reaction Presenting symptoms: no rash    the patient had shrimp and chicken at [employer] last night for dinner. Patient developed hives around 11:00 in the evening. Itchy type rash. No lip swelling no tongue swelling no difficulty breathing. No past history of shrimp allergy.  Past Medical History  Diagnosis Date  . Asthma     prn inhaler  . Migraines   . Metacarpal bone fracture 12/2013    left ring  . Chronic cough     states due to asthma  . Family history of anesthesia complication     states mother is hard to wake up post-op   Past Surgical History  Procedure Laterality Date  . Knee surgery Right   . Open reduction internal fixation (orif) metacarpal Left 01/15/2014    Procedure: OPEN REDUCTION INTERNAL FIXATION (ORIF) LEFT RING FINGER METACARPAL FRACTURE;  Surgeon: Betha LoaKevin Kuzma, MD;  Location: [facility];  Service: Orthopedics;  Laterality: Left;  . Open reduction internal fixation (orif) metacarpal Left 02/07/2014    Procedure: OPEN REDUCTION INTERNAL FIXATION (ORIF)  LEFT RING FINGER METACARPAL;  Surgeon: Betha LoaKevin Kuzma, MD;  Location: [facility];  Service: Orthopedics;  Laterality: Left;   Family History  Problem Relation Age of Onset  . Hypertension Mother   . Diabetes Mother   . Cancer Mother   . Anesthesia problems Mother     hard to wake up post-op  . Cancer Father   . Diabetes Father   . Hypertension Father    History  Substance Use Topics  . Smoking status: Current Some Day Smoker -- 10 years    Types: Cigarettes  . Smokeless tobacco: Never Used      Comment: 1 pack/week  . Alcohol Use: Yes     Comment: weekends    Review of Systems  Constitutional: Negative for fever.  HENT: Negative for congestion.   Eyes: Negative for visual disturbance.  Respiratory: Negative for shortness of breath.   Gastrointestinal: Negative for abdominal pain.  Genitourinary: Negative for dysuria.  Musculoskeletal: Negative for back pain.  Skin: Negative for rash.  Neurological: Negative for headaches.  Hematological: Does not bruise/bleed easily.  Psychiatric/Behavioral: Negative for confusion.      Allergies  Bactrim; Coconut oil; Pork-derived products; and Soap  Home Medications   Prior to Admission medications   Medication Sig Start Date End Date Taking? Authorizing Provider  albuterol (PROVENTIL HFA;VENTOLIN HFA) 108 (90 BASE) MCG/ACT inhaler Inhale 1 puff into the lungs every 6 (six) hours as needed for wheezing or shortness of breath. 12/31/13  Yes Mellody DrownLauren Parker, PA-C  oxyCODONE-acetaminophen (PERCOCET) 10-325 MG per tablet Take 1 tablet by mouth every 6 (six) hours as needed for pain. 02/07/14  Yes Betha LoaKevin Kuzma, MD  diphenhydrAMINE (BENADRYL) 25 MG tablet Take 1 tablet (25 mg total) by mouth every 6 (six) hours. 05/02/14   Vanetta MuldersScott Patrcia Schnepp, MD  famotidine (PEPCID) 20 MG tablet Take 1 tablet (20 mg total) by mouth 2 (two) times daily. 05/02/14   Vanetta MuldersScott Derrin Currey, MD  predniSONE (DELTASONE) 10 MG tablet Take 4 tablets (40 mg total) by mouth daily. 05/02/14  Vanetta MuldersScott Nichalas Coin, MD   BP 119/78 mmHg  Pulse 77  Temp(Src) 98.8 F (37.1 C) (Oral)  Resp 18  SpO2 100% Physical Exam  Constitutional: He is oriented to person, place, and time. He appears well-developed and well-nourished. No distress.  HENT:  Head: Normocephalic and atraumatic.  Mouth/Throat: Oropharynx is clear and moist.  Eyes: Conjunctivae and EOM are normal. Pupils are equal, round, and reactive to light.  Neck: Normal range of motion. Neck supple.  Cardiovascular: Normal  rate, regular rhythm and normal heart sounds.   Pulmonary/Chest: Effort normal and breath sounds normal.  Abdominal: Soft. Bowel sounds are normal. There is no tenderness.  Musculoskeletal: Normal range of motion.  Neurological: He is alert and oriented to person, place, and time. No cranial nerve deficit. He exhibits normal muscle tone. Coordination normal.  Skin: Skin is warm. Rash noted.  Hives to bilateral hands part of the forearm low bit on the back. And on over the upper legs and in the groin area.  Nursing note and vitals reviewed.   ED Course  Procedures (including critical care time) Labs Review Labs Reviewed - No data to display  Imaging Review No results found.   EKG Interpretation None      MDM   Final diagnoses:  Hives    Symptoms consistent with classic hives. Cause of the hives is not clear. Patient did have shrimp but he said trip in the past 9 problems. Possible new shrimp allergy. Perhaps just spontaneous urticaria. Patient without a lip swelling tongue swelling no wheezing. Patient in no acute distress. Patient will be discharged home after being treated here with Pepcid Benadryl and prednisone and he'll be continued on those medications.  In addition patient has some soreness around the glans penis particularly anteriorly recommend treating that with Polysporin of Neosporin type ointment and washing it daily with soap and water and following it carefully. Also recommended not D Trimm for the next several days.    Vanetta MuldersScott Halona Amstutz, MD 05/02/14 [phone]

## 2014-05-15 ENCOUNTER — Emergency Department (INDEPENDENT_AMBULATORY_CARE_PROVIDER_SITE_OTHER)
Admission: EM | Admit: 2014-05-15 | Discharge: 2014-05-15 | Disposition: A | Discharge status: Home / Self Care | Payer: Self-pay | Source: Home / Self Care | Attending: Emergency Medicine | Admitting: Emergency Medicine

## 2014-05-15 ENCOUNTER — Other Ambulatory Visit (HOSPITAL_COMMUNITY)
Admission: RE | Admit: 2014-05-15 | Discharge: 2014-05-15 | Disposition: A | Discharge status: Home / Self Care | Payer: Self-pay | Source: Ambulatory Visit | Attending: Emergency Medicine | Admitting: Emergency Medicine

## 2014-05-15 ENCOUNTER — Encounter (HOSPITAL_COMMUNITY): Payer: Self-pay

## 2014-05-15 DIAGNOSIS — N481 Balanitis: Secondary | ICD-10-CM

## 2014-05-15 DIAGNOSIS — Z113 Encounter for screening for infections with a predominantly sexual mode of transmission: Secondary | ICD-10-CM | POA: Insufficient documentation

## 2014-05-15 MED ORDER — KETOCONAZOLE 2 % EX CREA: 1.0000 "application " | TOPICAL_CREAM | Freq: Every day | CUTANEOUS | Status: DC

## 2014-05-15 NOTE — ED Notes (Signed)
C/o rash on penis x 1 month. Denies penile d/c, denies pain w urination. States his wife has been checked, and she is reportedly okay

## 2014-05-15 NOTE — ED Provider Notes (Signed)
   Chief Complaint   Rash   History of Present Illness   Mark Lam is a 33 year old male who's had a one-month history of a scaly rash on his penis. It itches, burns, and hurts. He has some swollen glands in his groin area bilaterally, and some small nodules on his medial thighs. He denies any urethral discharge or burning with urination. He has had also lesions on the penis. He's tried applying Neosporin cream without improvement. He denies any fever, chills, sore throat, skin rash, or joint pain.  Review of Systems   Other than as noted above, the patient denies any of the following symptoms: Systemic:  No fevers chills, arthralgias, or adenopathy. GI:  No abdominal pain, nausea or vomiting. GU:  No dysuria, penile pain, discharge, itching, dysuria, genital lesions, testicular pain or swelling. Skin:  No rash or itching.  PMFSH   Past medical history, family history, social history, meds, and allergies were reviewed. The patient is allergic to Bactrim. He has asthma. He's in a mutually monogamous relationship with a single partner.  Physical Examination    Vital signs:  BP 109/72 mmHg  Pulse 71  Temp(Src) 98.2 F (36.8 C) (Oral)  Resp 16  SpO2 99% Gen:  Alert, oriented, in no distress. ENT:  Pharynx clear, no oral lesions.  Abdomen:  Soft and flat, non-distended, and non-tender.  No organomegaly or mass. Genital:  There is a scaly rash circumferentially around the penis at the level of the coronal sulcus. The patient is circumcised. There no ulcerations, no urethral discharge, he has some tender inguinal lymphadenopathy bilaterally, right more so than left, testes are normal. Skin:  Warm and dry.  No rash.   Assessment   The encounter diagnosis was Balanitis.  This is balanitis. It could be due to fungal or yeast infection, or it could be an inflammatory condition such as Zoon's balanitis or circinate balanitis. Urine for GC and Chlamydia obtained as well as serologies  for HIV, syphilis, herpes simplex. Will treat as for a fungal or yeast infection. If no better in 2 weeks suggested follow-up with a urologist.  Plan    1.  Meds:  The following meds were prescribed:   Discharge Medication List as of 05/15/2014  3:16 PM    START taking these medications   Details  ketoconazole (NIZORAL) 2 % cream Apply 1 application topically daily., Starting 05/15/2014, Until Discontinued, Normal        2.  Patient Education/Counseling:  The patient was given appropriate handouts, self care instructions, and instructed in symptomatic relief.  3.  Follow up:  The patient was told to follow up here if no better in 3 to 4 days, or sooner if becoming worse in any way, and given some red flag symptoms such as fever, pain, or difficulty urinating which would prompt immediate return.  Follow-up with Dr. Heloise PurpuraLester Borden if no better in 2 weeks.     [name] C Saamir Armstrong, MD 05/15/14 [phone]

## 2014-05-15 NOTE — Discharge Instructions (Signed)
Balanitis  Balanitis is inflammation of the head of the penis (glans).   CAUSES   Balanitis has multiple causes, both infectious and noninfectious. Often balanitis is the result of poor personal hygiene, especially in uncircumcised males. Without adequate washing, viruses, bacteria, and yeast collect between the foreskin and the glans. This can cause an infection. Lack of air and irritation from a normal secretion called smegma contribute to the cause in uncircumcised males. Other causes include:   Chemical irritation from the use of certain soaps and shower gels (especially soaps with perfumes), condoms, personal lubricants, petroleum jelly, spermicides, and fabric conditioners.   Skin conditions, such as eczema, dermatitis, and psoriasis.   Allergies to drugs, such as tetracycline and sulfa.   Certain medical conditions, including liver cirrhosis, congestive heart failure, and kidney disease.   Morbid obesity.  RISK FACTORS   Diabetes mellitus.   A tight foreskin that is difficult to pull back past the glans (phimosis).   Sex without the use of a condom.  SIGNS AND SYMPTOMS   Symptoms may include:   Discharge coming from under the foreskin.   Tenderness.   Itching and inability to get an erection (because of the pain).   Redness and a rash.   Sores on the glans and on the foreskin.  DIAGNOSIS  Diagnosis of balanitis is confirmed through a physical exam.  TREATMENT  The treatment is based on the cause of the balanitis. Treatment may include:   Frequent cleansing.   Keeping the glans and foreskin dry.   Use of medicines such as creams, pain medicines, antibiotics, or medicines to treat fungal infections.   Sitz baths.  If the irritation has caused a scar on the foreskin that prevents easy retraction, a circumcision may be recommended.   HOME CARE INSTRUCTIONS   Sex should be avoided until the condition has cleared.  MAKE SURE YOU:   Understand these instructions.   Will watch your  condition.   Will get help right away if you are not doing well or get worse.  Document Released: 09/26/2008 Document Revised: 05/15/2013 Document Reviewed: 10/30/2012  ExitCare Patient Information 2015 [employer]. This information is not intended to replace advice given to you by your health care provider. Make sure you discuss any questions you have with your health care provider.

## 2014-05-16 ENCOUNTER — Telehealth (HOSPITAL_COMMUNITY): Payer: Self-pay | Admitting: *Deleted

## 2014-05-16 LAB — RPR TITER: RPR Titer: 1:32 {titer} — AB

## 2014-05-16 LAB — URINE CYTOLOGY ANCILLARY ONLY
Chlamydia: NEGATIVE
Neisseria Gonorrhea: NEGATIVE
Trichomonas: NEGATIVE

## 2014-05-16 LAB — HIV ANTIBODY (ROUTINE TESTING W REFLEX): HIV 1&2 Ab, 4th Generation: NONREACTIVE

## 2014-05-16 LAB — FLUORESCENT TREPONEMAL AB(FTA)-IGG-BLD: Fluorescent Treponemal Ab, IgG: REACTIVE — AB

## 2014-05-16 LAB — RPR: RPR Ser Ql: REACTIVE — AB

## 2014-05-16 LAB — HSV 2 ANTIBODY, IGG: HSV 2 Glycoprotein G Ab, IgG: 12.67 IV — ABNORMAL HIGH

## 2014-05-16 NOTE — ED Notes (Addendum)
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  Discussed with Dr. Piedad ClimesHonig  She said pt. needs to come back for treatment with Penicillin. Call pt. and tell him no sex until treated. He can go to the ED if he wants treatment before Saturday.  I called and left a message for pt. to call.  Call 1. Vassie MoselleYork, Clerance Umland M 05/16/2014

## 2014-05-18 ENCOUNTER — Encounter (HOSPITAL_COMMUNITY): Payer: Self-pay | Admitting: Emergency Medicine

## 2014-05-18 ENCOUNTER — Emergency Department (INDEPENDENT_AMBULATORY_CARE_PROVIDER_SITE_OTHER)
Admission: EM | Admit: 2014-05-18 | Discharge: 2014-05-18 | Disposition: A | Discharge status: Home / Self Care | Payer: Self-pay | Source: Home / Self Care | Attending: Emergency Medicine | Admitting: Emergency Medicine

## 2014-05-18 DIAGNOSIS — B009 Herpesviral infection, unspecified: Secondary | ICD-10-CM

## 2014-05-18 DIAGNOSIS — A539 Syphilis, unspecified: Secondary | ICD-10-CM

## 2014-05-18 HISTORY — DX: Herpesviral infection, unspecified: B00.9

## 2014-05-18 HISTORY — DX: Syphilis, unspecified: A53.9

## 2014-05-18 MED ORDER — ACYCLOVIR 400 MG PO TABS: 400.0000 mg | ORAL_TABLET | Freq: Every day | ORAL | Status: DC

## 2014-05-18 MED ORDER — PENICILLIN G BENZATHINE 1200000 UNIT/2ML IM SUSP
2.4000 10*6.[IU] | Freq: Once | INTRAMUSCULAR | Status: AC
  Administered 2014-05-18: 2.4 10*6.[IU] via INTRAMUSCULAR

## 2014-05-18 MED ORDER — PENICILLIN G BENZATHINE 1200000 UNIT/2ML IM SUSP
INTRAMUSCULAR | Status: AC
  Filled 2014-05-18: qty 4

## 2014-05-18 NOTE — Discharge Instructions (Signed)
Syphilis Syphilis is an infectious disease. It can cause serious complications if left untreated.  CAUSES  Syphilis is caused by a type of bacteria called Treponema pallidum. It is most commonly spread through sexual contact. Syphilis may also spread to a fetus through the blood of the mother.  SIGNS AND SYMPTOMS Symptoms vary depending on the stage of the disease. Some symptoms may disappear without treatment. However, this does not mean that the infection is gone. One form of syphilis (called latent syphilis) has no symptoms.  Primary Syphilis  Painless sores (chancres) in and around the genital organs and mouth.  Swollen lymph nodes near the sores. Secondary Syphilis  A rash or sores over any portion of the body, including the palms of the hands and soles of the feet.  Fever.  Headache.  Sore throat.  Swollen lymph nodes.  New sores in the mouth or on the genitals.  Feeling generally ill.  Having pain in the joints. Tertiary Syphilis The third stage of syphilis involves severe damage to different organs in the body, such as the brain, spinal cord, and heart. Signs and symptoms may include:   Dementia.  Personality and mood changes.  Difficulty walking.  Heart failure.  Fainting.  Enlargement (aneurysm) of the aorta.  Tumors of the skin, bones, or liver.  Muscle weakness.  Sudden "lightning" pains, numbness, or tingling.  Problems with coordination.  Vision changes. DIAGNOSIS   A physical exam will be done.  Blood tests will be done to confirm the diagnosis.  If the disease is in the first or second stages, a fluid (drainage) sample from a sore or rash may be examined under a microscope to detect the disease-causing bacteria.  Fluid around the spine may need to be examined to detect brain damage or inflammation of the brain lining (meningitis).  If the disease is in the third stage, X-rays, CT scans, MRIs, echocardiograms, ultrasounds, or cardiac  catheterization may also be done to detect disease of the heart, aorta, or brain. TREATMENT  Syphilis can be cured with antibiotic medicine if a diagnosis is made early. During the first day of treatment, you may experience fever, chills, headache, nausea, or aching all over your body. This is a normal reaction to the antibiotics.  HOME CARE INSTRUCTIONS   Take your antibiotic medicine as directed by your health care provider. Finish the antibiotic even if you start to feel better. Incomplete treatment will put you at risk for continued infection and could be life threatening.  Take medicines only as directed by your health care provider.  Do not have sexual intercourse until your treatment is completed or as directed by your health care provider.  Inform your recent sexual partners that you were diagnosed with syphilis. They need to seek care and treatment, even if they have no symptoms. It is necessary that all your sexual partners be tested for infection and treated if they have the disease.  Keep all follow-up visits as directed by your health care provider. It is important to keep all your appointments.  If your test results are not ready during your visit, make an appointment with your health care provider to find out the results. Do not assume everything is normal if you have not heard from your health care provider or the medical facility. It is your responsibility to get your test results. SEEK MEDICAL CARE IF:  You continue to have any of the following 24 hours after beginning treatment:  Fever.  Chills.  Headache.    Nausea.  Aching all over your body.  You have symptoms of an allergic reaction to medicine, such as:  Chills.  A headache.  Light-headedness.  A new rash (especially hives).  Difficulty breathing. MAKE SURE YOU:   Understand these instructions.  Will watch your condition.  Will get help right away if you are not doing well or get worse. Document  Released: 02/28/2013 Document Revised: 09/24/2013 Document Reviewed: 02/28/2013 Exit[employer] Patient Information 2015 [employer]. This information is not intended to replace advice given to you by your health care provider. Make sure you discuss any questions you have with your health care provider.  

## 2014-05-18 NOTE — Telephone Encounter (Signed)
-----   Message from Vassie MoselleSuzanne M York, RN sent at 05/16/2014  5:11 PM EST ----- Regarding: lab RPR reactive, titer 1:32, Fluor. 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  Lab shown to Dr. Piedad ClimesHonig.  She said, he needs to come in for treatment with PCN and no sex until treated.   I called pt. left message for him to call. Vassie MoselleYork, Suzanne M 05/16/2014

## 2014-05-18 NOTE — ED Notes (Signed)
Pt did not want to stay the 15 minutes after injections. He stated that he had to leave. Educated that to pt.

## 2014-05-18 NOTE — Progress Notes (Signed)
Quick Note:  Test result was normal. No further action is needed at this time. Please call patient and inform him of these normal results. ______

## 2014-05-18 NOTE — ED Provider Notes (Signed)
Chief Complaint   Exposure to STD and Follow-up   History of Present Illness   Mark Lam is a 33 year old male who was here 5 days ago with a scaly, irritated lesion on the penis, located around the coronal sulcus. At that time lab work was done including HIV and RPR as well as DNA probes for gonorrhea and Chlamydia. He was treated with ketoconazole cream which he has not gotten filled yet due to cost. He states the lesion is healing up right now. He admits to me today that he has had another partner besides his wife and has not used a condom. He denies any skin rash or joint pain. His serologies came back positive for syphilis with a positive fluorescent treponemal antibody. 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 He returns today for treatment.  Review of Systems   Other than as noted above, the patient denies any of the following symptoms: Systemic:  No fevers chills, arthralgias, or adenopathy. GI:  No abdominal pain, nausea or vomiting. GU:  No dysuria, penile pain, discharge, itching, dysuria, genital lesions, testicular pain or swelling. Skin:  No rash or itching.  PMFSH   Past medical history, family history, social history, meds, and allergies were reviewed.   Physical Examination    Vital signs:  BP 120/93 mmHg  Pulse 93  Temp(Src) 98.5 F (36.9 C) (Oral)  Resp 16  SpO2 96% Gen:  Alert, oriented, in no distress. ENT:  Pharynx clear, no oral lesions.  Abdomen:  Soft and flat, non-distended, and non-tender.  No organomegaly or mass. Genital:  Exam of the penis today reveals interval improvement. There is still some irritation and scaling and erythema, but there is no ulceration and no breaks in the skin. Skin:  Warm and dry.  No rash.   Labs   Results for orders placed or performed during the hospital encounter of 05/15/14  HIV antibody  Result Value Ref Range   HIV 1&2 Ab,  4th Generation NONREACTIVE NONREACTIVE  RPR  Result Value Ref Range   RPR Reactive (A) NON REAC  HSV 2 antibody, IgG  Result Value Ref Range   HSV 2 Glycoprotein G Ab, IgG 12.67 (H) IV  Rpr titer  Result Value Ref Range   RPR Titer 1:32 (A) NON REACTIVE  Fluorescent treponemal ab(fta)-IgG-bld  Result Value Ref Range   Fluorescent Treponemal AB, IgG Reactive (A)   Urine cytology ancillary only  Result Value Ref Range   Chlamydia CT: Negative    Neisseria gonorrhea NG: Negative    Trich Trichomonas: Negative      Course in Urgent Care Center   The following medications were given:  Medications  penicillin g benzathine (BICILLIN LA) 1200000 UNIT/2ML injection 2.4 Million Units (2.4 Million Units Intramuscular Given 05/18/14 1402)   Assessment   The primary encounter diagnosis was Syphilis. 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  Plan    1.  Meds:  The following meds were prescribed:   Discharge Medication List as of 05/18/2014  1:48 PM    START taking these medications   Details  acyclovir (ZOVIRAX) 400 MG tablet Take 1 tablet (400 mg total) by mouth 5 (five) times daily., Starting 05/18/2014, Until Discontinued, Normal        2.  Patient Education/Counseling:  The patient was given appropriate handouts, self care instructions, and instructed in symptomatic relief.The patient was  instructed to inform all sexual contacts, avoid intercourse completely for 2 weeks and then only with a condom.  The patient was told that we would call about all abnormal lab results, and that we would need to report certain kinds of infection to the health department.    3.  Follow up:  The patient was told to follow up here if no better in 3 to 4 days, or sooner if becoming worse in any way, and given some red flag symptoms such as fever, pain, or difficulty urinating which would prompt immediate return.  Follow-up at the health department for repeat RPRs.     [name]  C Tisha Cline, MD 05/18/14 1556

## 2014-05-18 NOTE — ED Notes (Signed)
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 He will need treatment. He was called and informed of this result. He's going to come in today to get the injection of penicillin. I'm going to go ahead and give him the acyclovir for his HSV infection. He'll need to follow-up with the health department, and use protection in the meantime. I also told him his wife should be  Checked and treated as well.  Mark Likesavid C Skye Rodarte, MD 05/18/14 [phone]

## 2014-05-18 NOTE — ED Notes (Signed)
Pt is here for follow up with STD exposure. Pt is here for treatment.

## 2014-05-19 NOTE — ED Notes (Signed)
[facility]. Vassie MoselleYork, Joshuah Minella M 05/19/2014

## 2014-11-14 ENCOUNTER — Emergency Department (HOSPITAL_COMMUNITY)
Admission: EM | Admit: 2014-11-14 | Discharge: 2014-11-14 | Disposition: A | Discharge status: Home / Self Care | Payer: Self-pay | Attending: Emergency Medicine | Admitting: Emergency Medicine

## 2014-11-14 ENCOUNTER — Encounter (HOSPITAL_COMMUNITY): Payer: Self-pay | Admitting: Emergency Medicine

## 2014-11-14 ENCOUNTER — Emergency Department (HOSPITAL_COMMUNITY): Payer: Self-pay

## 2014-11-14 DIAGNOSIS — S99912A Unspecified injury of left ankle, initial encounter: Secondary | ICD-10-CM | POA: Insufficient documentation

## 2014-11-14 DIAGNOSIS — S99922A Unspecified injury of left foot, initial encounter: Secondary | ICD-10-CM | POA: Insufficient documentation

## 2014-11-14 DIAGNOSIS — Y939 Activity, unspecified: Secondary | ICD-10-CM | POA: Insufficient documentation

## 2014-11-14 DIAGNOSIS — Y929 Unspecified place or not applicable: Secondary | ICD-10-CM | POA: Insufficient documentation

## 2014-11-14 DIAGNOSIS — Z79899 Other long term (current) drug therapy: Secondary | ICD-10-CM | POA: Insufficient documentation

## 2014-11-14 DIAGNOSIS — M25572 Pain in left ankle and joints of left foot: Secondary | ICD-10-CM

## 2014-11-14 DIAGNOSIS — Z720 Tobacco use: Secondary | ICD-10-CM | POA: Insufficient documentation

## 2014-11-14 DIAGNOSIS — Z8781 Personal history of (healed) traumatic fracture: Secondary | ICD-10-CM | POA: Insufficient documentation

## 2014-11-14 DIAGNOSIS — Z8619 Personal history of other infectious and parasitic diseases: Secondary | ICD-10-CM | POA: Insufficient documentation

## 2014-11-14 DIAGNOSIS — J45909 Unspecified asthma, uncomplicated: Secondary | ICD-10-CM | POA: Insufficient documentation

## 2014-11-14 DIAGNOSIS — Y990 Civilian activity done for income or pay: Secondary | ICD-10-CM | POA: Insufficient documentation

## 2014-11-14 DIAGNOSIS — Z7952 Long term (current) use of systemic steroids: Secondary | ICD-10-CM | POA: Insufficient documentation

## 2014-11-14 DIAGNOSIS — Z8679 Personal history of other diseases of the circulatory system: Secondary | ICD-10-CM | POA: Insufficient documentation

## 2014-11-14 DIAGNOSIS — X58XXXA Exposure to other specified factors, initial encounter: Secondary | ICD-10-CM | POA: Insufficient documentation

## 2014-11-14 DIAGNOSIS — M79672 Pain in left foot: Secondary | ICD-10-CM

## 2014-11-14 MED ORDER — IBUPROFEN 800 MG PO TABS: 800.0000 mg | ORAL_TABLET | Freq: Three times a day (TID) | ORAL | Status: DC | PRN

## 2014-11-14 MED ORDER — IBUPROFEN 400 MG PO TABS: 800.0000 mg | ORAL_TABLET | Freq: Once | ORAL | Status: DC

## 2014-11-14 NOTE — Discharge Instructions (Signed)

## 2014-11-14 NOTE — ED Notes (Signed)
Pt twisted left ankle last pm while at work. No deformity noted.

## 2014-11-14 NOTE — ED Provider Notes (Signed)
CSN: [id number]     Arrival date & time 11/14/14  1450 History  This chart was scribed for non-physician practitioner,Veniamin Kincaid Biagio Borg, working with Mancel Bale, MD, by Budd Palmer ED Scribe. This patient was seen in room TR11C/TR11C and the patient's care was started at 3:07 PM  Chief Complaint  Patient presents with  . Ankle Pain   The history is provided by the patient. No language interpreter was used.   HPI Comments: Mark Lam is a 34 y.o. male who presents to the Emergency Department complaining of left inversion ankle injury onset 1 day ago, when his foot got caught, and he rolled his ankle at work. He reports constant, stabbing pain, currently rated as 7/10, on the inside of the ankle and the bottom of the foot. He reports relief from rest, elevation and soaking in alcohol. Weight bearing exacerbates the pain to an 11/10. He denies fever, SOB, CP. He reports taking a muscle relaxant for the pain PTA with mild relief.  Past Medical History  Diagnosis Date  . Asthma     prn inhaler  . Migraines   . Metacarpal bone fracture 12/2013    left ring  . Chronic cough     states due to asthma  . Family history of anesthesia complication     states mother is hard to wake up post-op  . Herpes   . Syphilis    Past Surgical History  Procedure Laterality Date  . Knee surgery Right   . Open reduction internal fixation (orif) metacarpal Left 01/15/2014    Procedure: OPEN REDUCTION INTERNAL FIXATION (ORIF) LEFT RING FINGER METACARPAL FRACTURE;  Surgeon: Betha Loa, MD;  Location: [facility];  Service: Orthopedics;  Laterality: Left;  . Open reduction internal fixation (orif) metacarpal Left 02/07/2014    Procedure: OPEN REDUCTION INTERNAL FIXATION (ORIF)  LEFT RING FINGER METACARPAL;  Surgeon: Betha Loa, MD;  Location: [facility];  Service: Orthopedics;  Laterality: Left;   Family History  Problem Relation Age of Onset  . Hypertension Mother   .  Diabetes Mother   . Cancer Mother   . Anesthesia problems Mother     hard to wake up post-op  . Cancer Father   . Diabetes Father   . Hypertension Father    History  Substance Use Topics  . Smoking status: Current Some Day Smoker -- 10 years    Types: Cigarettes  . Smokeless tobacco: Never Used     Comment: 1 pack/week  . Alcohol Use: Yes     Comment: weekends    Review of Systems  Constitutional: Negative for fever.  Respiratory: Negative for shortness of breath.   Cardiovascular: Negative for chest pain.  Musculoskeletal: Positive for joint swelling and arthralgias.  Skin: Negative for color change, rash and wound.  Allergic/Immunologic: Negative for immunocompromised state.  Neurological: Negative for weakness.  Hematological: Does not bruise/bleed easily.  Psychiatric/Behavioral: Negative for self-injury (accidental).    Allergies  Bactrim; Coconut oil; Pork-derived products; and Soap  Home Medications   Prior to Admission medications   Medication Sig Start Date End Date Taking? Authorizing Provider  acyclovir (ZOVIRAX) 400 MG tablet Take 1 tablet (400 mg total) by mouth 5 (five) times daily. 05/18/14   Reuben Likes, MD  albuterol (PROVENTIL HFA;VENTOLIN HFA) 108 (90 BASE) MCG/ACT inhaler Inhale 1 puff into the lungs every 6 (six) hours as needed for wheezing or shortness of breath. 12/31/13   Mellody Drown, PA-C  diphenhydrAMINE (BENADRYL)  25 MG tablet Take 1 tablet (25 mg total) by mouth every 6 (six) hours. 05/02/14   Vanetta Mulders, MD  famotidine (PEPCID) 20 MG tablet Take 1 tablet (20 mg total) by mouth 2 (two) times daily. 05/02/14   Vanetta Mulders, MD  ketoconazole (NIZORAL) 2 % cream Apply 1 application topically daily. 05/15/14   Reuben Likes, MD  oxyCODONE-acetaminophen (PERCOCET) 10-325 MG per tablet Take 1 tablet by mouth every 6 (six) hours as needed for pain. 02/07/14   Betha Loa, MD  predniSONE (DELTASONE) 10 MG tablet Take 4 tablets (40 mg total)  by mouth daily. 05/02/14   Vanetta Mulders, MD   BP 129/71 mmHg  Pulse 57  Temp(Src) 97.7 F (36.5 C) (Oral)  Resp 18  SpO2 99% Physical Exam  Constitutional: He appears well-developed and well-nourished. No distress.  HENT:  Head: Normocephalic and atraumatic.  Neck: Neck supple.  Cardiovascular:  DP 2+  Pulmonary/Chest: Effort normal.  Musculoskeletal:  LLE: Calf soft, nontender Moves all toes Distal sensation intact, cap refill <2sec Mild tenderness over medial malleolus, tendernes over calcaneous posteriorly, inferiorly, and medially No other bony tendernes throughout the foot Thompson Test negative  Neurological: He is alert.  Skin: He is not diaphoretic.  Nursing note and vitals reviewed.   ED Course  Procedures  DIAGNOSTIC STUDIES: Oxygen Saturation is 99% on RA, normal by my interpretation.    COORDINATION OF CARE: 3:10 PM - Discussed plans to order diagnostic imaging and pain medication. Pt advised of plan for treatment and pt agrees.  Labs Review Labs Reviewed - No data to display  Imaging Review Dg Ankle Complete Left  11/14/2014   CLINICAL DATA:  Patient twisted his left lower 11/13/2014. Pain. Initial encounter.  EXAM: LEFT ANKLE COMPLETE - 3+ VIEW  COMPARISON:  None.  FINDINGS: Imaged bones, joints and soft tissues appear normal.  IMPRESSION: Negative exam.   Electronically Signed   By: Drusilla Kanner M.D.   On: 11/14/2014 15:51   Dg Foot Complete Left  11/14/2014   CLINICAL DATA:  Twisting injury left ankle and foot 11/13/2014. Pain. Initial encounter.  EXAM: LEFT FOOT - COMPLETE 3+ VIEW  COMPARISON:  None.  FINDINGS: Imaged bones, joints and soft tissues appear normal.  IMPRESSION: Normal exam.   Electronically Signed   By: Drusilla Kanner M.D.   On: 11/14/2014 15:51     EKG Interpretation None      MDM   Final diagnoses:  Left ankle pain  Heel pain, left    Afebrile, nontoxic patient with pain in left heel and ankle after inversion  injury at work yesterday.  Neurovascularly intact.  Xrays negative.   D/C home with aso, pt declined crutches and ibuprofen.  Discussed result, findings, treatment, and follow up  with patient.  Pt given return precautions.  Pt verbalizes understanding and agrees with plan.       I personally performed the services described in this documentation, which was scribed in my presence. The recorded information has been reviewed and is accurate.   Trixie Dredge, PA-C 11/14/14 1734  Mancel Bale, MD 11/18/14 [phone]

## 2014-11-14 NOTE — ED Notes (Signed)
Ortho tech st's he will apply ASO splint.  Pt refuses crutches and Ibuprofen

## 2014-11-14 NOTE — Progress Notes (Signed)
Orthopedic Tech Progress Note Patient Details:  Mark Lam 1981/01/01 [id number]  Ortho Devices Type of Ortho Device: ASO Ortho Device/Splint Location: LLE Ortho Device/Splint Interventions: Ordered, Application   [name] 11/14/2014, 4:29 PM

## 2014-12-09 ENCOUNTER — Encounter (HOSPITAL_COMMUNITY): Payer: Self-pay | Admitting: Emergency Medicine

## 2014-12-09 ENCOUNTER — Emergency Department (HOSPITAL_COMMUNITY)
Admission: EM | Admit: 2014-12-09 | Discharge: 2014-12-09 | Disposition: A | Discharge status: Home / Self Care | Payer: Self-pay | Attending: Emergency Medicine | Admitting: Emergency Medicine

## 2014-12-09 DIAGNOSIS — Z79899 Other long term (current) drug therapy: Secondary | ICD-10-CM | POA: Insufficient documentation

## 2014-12-09 DIAGNOSIS — Z720 Tobacco use: Secondary | ICD-10-CM | POA: Insufficient documentation

## 2014-12-09 DIAGNOSIS — Z8619 Personal history of other infectious and parasitic diseases: Secondary | ICD-10-CM | POA: Insufficient documentation

## 2014-12-09 DIAGNOSIS — Z7952 Long term (current) use of systemic steroids: Secondary | ICD-10-CM | POA: Insufficient documentation

## 2014-12-09 DIAGNOSIS — R21 Rash and other nonspecific skin eruption: Secondary | ICD-10-CM | POA: Insufficient documentation

## 2014-12-09 DIAGNOSIS — J45909 Unspecified asthma, uncomplicated: Secondary | ICD-10-CM | POA: Insufficient documentation

## 2014-12-09 DIAGNOSIS — Z8679 Personal history of other diseases of the circulatory system: Secondary | ICD-10-CM | POA: Insufficient documentation

## 2014-12-09 DIAGNOSIS — Z8781 Personal history of (healed) traumatic fracture: Secondary | ICD-10-CM | POA: Insufficient documentation

## 2014-12-09 MED ORDER — CEPHALEXIN 500 MG PO CAPS: 500.0000 mg | ORAL_CAPSULE | Freq: Four times a day (QID) | ORAL | Status: DC

## 2014-12-09 MED ORDER — ACYCLOVIR 400 MG PO TABS
400.0000 mg | ORAL_TABLET | Freq: Every day | ORAL | Status: DC
Start: 2014-12-09 — End: 2015-03-24

## 2014-12-09 NOTE — ED Notes (Signed)
Pt c/o abscess near buttock area x 1 month

## 2014-12-09 NOTE — Discharge Instructions (Signed)
Acyclovir for possible herpes outbreak. Keflex for possible infection. Follow-up as needed.  Genital Herpes Genital herpes is a sexually transmitted disease. This means that it is a disease passed by having sex with an infected person. There is no cure for genital herpes. The time between attacks can be months to years. The virus may live in a person but produce no problems (symptoms). This infection can be passed to a baby as it travels down the birth canal (vagina). In a newborn, this can cause central nervous system damage, eye damage, or even death. The virus that causes genital herpes is usually HSV-2 virus. The virus that causes oral herpes is usually HSV-1. The diagnosis (learning what is wrong) is made through culture results. SYMPTOMS  Usually symptoms of pain and itching begin a few days to a week after contact. It first appears as small blisters that progress to small painful ulcers which then scab over and heal after several days. It affects the outer genitalia, birth canal, cervix, penis, anal area, buttocks, and thighs. HOME CARE INSTRUCTIONS   Keep ulcerated areas dry and clean.  Take medications as directed. Antiviral medications can speed up healing. They will not prevent recurrences or cure this infection. These medications can also be taken for suppression if there are frequent recurrences.  While the infection is active, it is contagious. Avoid all sexual contact during active infections.  Condoms may help prevent spread of the herpes virus.  Practice safe sex.  Wash your hands thoroughly after touching the genital area.  Avoid touching your eyes after touching your genital area.  Inform your caregiver if you have had genital herpes and become pregnant. It is your responsibility to insure a safe outcome for your baby in this pregnancy.  Only take over-the-counter or prescription medicines for pain, discomfort, or fever as directed by your caregiver. SEEK MEDICAL CARE IF:     You have a recurrence of this infection.  You do not respond to medications and are not improving.  You have new sources of pain or discharge which have changed from the original infection.  You have an oral temperature above 102 F (38.9 C).  You develop abdominal pain.  You develop eye pain or signs of eye infection. Document Released: 05/07/2000 Document Revised: 08/02/2011 Document Reviewed: 05/28/2009 [facility] Patient Information 2015 [employer] [place]. This information is not intended to replace advice given to you by your health care provider. Make sure you discuss any questions you have with your health care provider.

## 2014-12-09 NOTE — ED Provider Notes (Signed)
CSN: [id number]     Arrival date & time 12/09/14  0911 History  This chart was scribed for non-physician practitioner, Lottie Mussel, PA-C, working with Mancel Bale, MD by Charline Bills, ED Scribe. This patient was seen in room TR11C/TR11C and the patient's care was started at 11:05 AM.   Chief Complaint  Patient presents with  . Abscess   The history is provided by the patient. No language interpreter was used.   HPI Comments: Mark Lam is a 34 y.o. male, with a h/o herpes and syphilis, who presents to the Emergency Department complaining of a gradually worsening painful rash to right buttock and genitalia for the past 3 weeks. Pt states that rash started as small whiteheads that have spread and grown in size. Pain is exacerbated with palpation and rubbing against the area. Pt states that he no longer shaves his genitalia. He denies h/o previous symptoms. He has applied lotion to the without relief. Pt also denies possibility of STDs and penile discharge.   Past Medical History  Diagnosis Date  . Asthma     prn inhaler  . Migraines   . Metacarpal bone fracture 12/2013    left ring  . Chronic cough     states due to asthma  . Family history of anesthesia complication     states mother is hard to wake up post-op  . Herpes   . Syphilis    Past Surgical History  Procedure Laterality Date  . Knee surgery Right   . Open reduction internal fixation (orif) metacarpal Left 01/15/2014    Procedure: OPEN REDUCTION INTERNAL FIXATION (ORIF) LEFT RING FINGER METACARPAL FRACTURE;  Surgeon: Betha Loa, MD;  Location: [facility];  Service: Orthopedics;  Laterality: Left;  . Open reduction internal fixation (orif) metacarpal Left 02/07/2014    Procedure: OPEN REDUCTION INTERNAL FIXATION (ORIF)  LEFT RING FINGER METACARPAL;  Surgeon: Betha Loa, MD;  Location: [facility];  Service: Orthopedics;  Laterality: Left;   Family History  Problem Relation Age of  Onset  . Hypertension Mother   . Diabetes Mother   . Cancer Mother   . Anesthesia problems Mother     hard to wake up post-op  . Cancer Father   . Diabetes Father   . Hypertension Father    History  Substance Use Topics  . Smoking status: Current Some Day Smoker -- 10 years    Types: Cigarettes  . Smokeless tobacco: Never Used     Comment: 1 pack/week  . Alcohol Use: Yes     Comment: weekends    Review of Systems  Genitourinary: Negative for discharge.  Skin: Positive for rash.   Allergies  Bactrim; Coconut oil; Pork-derived products; and Soap  Home Medications   Prior to Admission medications   Medication Sig Start Date End Date Taking? Authorizing Provider  acyclovir (ZOVIRAX) 400 MG tablet Take 1 tablet (400 mg total) by mouth 5 (five) times daily. 05/18/14   Reuben Likes, MD  albuterol (PROVENTIL HFA;VENTOLIN HFA) 108 (90 BASE) MCG/ACT inhaler Inhale 1 puff into the lungs every 6 (six) hours as needed for wheezing or shortness of breath. 12/31/13   Mellody Drown, PA-C  diphenhydrAMINE (BENADRYL) 25 MG tablet Take 1 tablet (25 mg total) by mouth every 6 (six) hours. 05/02/14   Vanetta Mulders, MD  famotidine (PEPCID) 20 MG tablet Take 1 tablet (20 mg total) by mouth 2 (two) times daily. 05/02/14   Vanetta Mulders, MD  ibuprofen (  ADVIL,MOTRIN) 800 MG tablet Take 1 tablet (800 mg total) by mouth every 8 (eight) hours as needed for mild pain or moderate pain. 11/14/14   Trixie DredgeEmily West, PA-C  ketoconazole (NIZORAL) 2 % cream Apply 1 application topically daily. 05/15/14   [name] C Keller, MD  oxyCODONE-acetaminophen (PERCOCET) 10-325 MG per tablet Take 1 tablet by mouth every 6 (six) hours as needed for pain. 02/07/14   Betha LoaKevin Kuzma, MD  predniSONE (DELTASONE) 10 MG tablet Take 4 tablets (40 mg total) by mouth daily. 05/02/14   Vanetta MuldersScott Zackowski, MD   BP 113/60 mmHg  Pulse 57  Temp(Src) 98 F (36.7 C) (Oral)  Resp 20  Ht 5\' 9"  (1.753 m)  Wt 185 lb (83.915 kg)  BMI 27.31 kg/m2   SpO2 99% Physical Exam  Constitutional: He is oriented to person, place, and time. He appears well-developed and well-nourished. No distress.  HENT:  Head: Normocephalic and atraumatic.  Eyes: Conjunctivae and EOM are normal.  Neck: Neck supple. No tracheal deviation present.  Cardiovascular: Normal rate.   Pulmonary/Chest: Effort normal. No respiratory distress.  Genitourinary:  Multiple papules, pustules, vesicles over perineum. No penis or scrotum involvement. No penile discharge. Tender to palpation.  Musculoskeletal: Normal range of motion.  Neurological: He is alert and oriented to person, place, and time.  Skin: Skin is warm and dry.  Psychiatric: He has a normal mood and affect. His behavior is normal.  Nursing note and vitals reviewed.  ED Course  Procedures (including critical care time) DIAGNOSTIC STUDIES: Oxygen Saturation is 99% on RA, normal by my interpretation.    COORDINATION OF CARE: 11:10 AM-Discussed treatment plan which includes Keflex and Zovirax with pt at bedside and pt agreed to plan.   Labs Review Labs Reviewed - No data to display  Imaging Review No results found.   EKG Interpretation None      MDM   Final diagnoses:  Rash of genital area    patient with folliculitis versus herpes outbreak. He has a history of herpes in the past. Will cover with acyclovir and will place on Keflex. Instructed to follow with her primary care doctor or health department.  Filed Vitals:   12/09/14 0919 12/09/14 0922 12/09/14 1134  BP: 113/60  116/71  Pulse: 57  52  Temp: 98 F (36.7 C)    TempSrc: Oral    Resp: 20  17  Height:  5\' 9"  (1.753 m)   Weight:  185 lb (83.915 kg)   SpO2: 99%  100%    I personally performed the services described in this documentation, which was scribed in my presence. The recorded information has been reviewed and is accurate.    Jaynie Crumbleatyana Niklaus Mamaril, PA-C 12/09/14 1440  Mancel BaleElliott Wentz, MD 12/09/14 [phone]

## 2015-03-24 ENCOUNTER — Encounter (HOSPITAL_COMMUNITY): Payer: Self-pay | Admitting: *Deleted

## 2015-03-24 ENCOUNTER — Emergency Department (HOSPITAL_COMMUNITY)
Admission: EM | Admit: 2015-03-24 | Discharge: 2015-03-24 | Disposition: A | Discharge status: Home / Self Care | Payer: Self-pay | Attending: Emergency Medicine | Admitting: Emergency Medicine

## 2015-03-24 DIAGNOSIS — Z8619 Personal history of other infectious and parasitic diseases: Secondary | ICD-10-CM | POA: Insufficient documentation

## 2015-03-24 DIAGNOSIS — Z8781 Personal history of (healed) traumatic fracture: Secondary | ICD-10-CM | POA: Insufficient documentation

## 2015-03-24 DIAGNOSIS — J45909 Unspecified asthma, uncomplicated: Secondary | ICD-10-CM | POA: Insufficient documentation

## 2015-03-24 DIAGNOSIS — Z720 Tobacco use: Secondary | ICD-10-CM | POA: Insufficient documentation

## 2015-03-24 DIAGNOSIS — J029 Acute pharyngitis, unspecified: Secondary | ICD-10-CM | POA: Insufficient documentation

## 2015-03-24 DIAGNOSIS — Z79899 Other long term (current) drug therapy: Secondary | ICD-10-CM | POA: Insufficient documentation

## 2015-03-24 DIAGNOSIS — Z8679 Personal history of other diseases of the circulatory system: Secondary | ICD-10-CM | POA: Insufficient documentation

## 2015-03-24 MED ORDER — PENICILLIN G BENZATHINE 1200000 UNIT/2ML IM SUSP
1.2000 10*6.[IU] | Freq: Once | INTRAMUSCULAR | Status: AC
  Administered 2015-03-24: 1.2 10*6.[IU] via INTRAMUSCULAR
  Filled 2015-03-24: qty 2

## 2015-03-24 MED ORDER — FLUTICASONE PROPIONATE 50 MCG/ACT NA SUSP: 2.0000 | Freq: Every day | NASAL | Status: AC

## 2015-03-24 MED ORDER — IBUPROFEN 800 MG PO TABS: 800.0000 mg | ORAL_TABLET | Freq: Three times a day (TID) | ORAL | Status: AC

## 2015-03-24 NOTE — ED Notes (Signed)
NAD at this time. Pt is stable and going home.  

## 2015-03-24 NOTE — ED Provider Notes (Signed)
CSN: [id number]     Arrival date & time 03/24/15  1030 History   First MD Initiated Contact with Patient 03/24/15 1040     No chief complaint on file.    (Consider location/radiation/quality/duration/timing/severity/associated sxs/prior Treatment) HPI Comments: 34 year old male, denies significant past medical history other than occasional asthma who has had approximately 24 hours of sore throat, he has some drainage down the back of his throat, it is sore constantly, worse with swallowing, not associated with fevers. He does report one episode of vomiting yesterday but does not feel sick on his stomach right now. Occasional coughing, sick contacts at work. Symptoms are persistent, nothing improves his symptoms at home  The history is provided by the patient.    Past Medical History  Diagnosis Date  . Asthma     prn inhaler  . Migraines   . Metacarpal bone fracture 12/2013    left ring  . Chronic cough     states due to asthma  . Family history of anesthesia complication     states mother is hard to wake up post-op  . Herpes   . Syphilis    Past Surgical History  Procedure Laterality Date  . Knee surgery Right   . Open reduction internal fixation (orif) metacarpal Left 01/15/2014    Procedure: OPEN REDUCTION INTERNAL FIXATION (ORIF) LEFT RING FINGER METACARPAL FRACTURE;  Surgeon: Betha Loa, MD;  Location: [facility];  Service: Orthopedics;  Laterality: Left;  . Open reduction internal fixation (orif) metacarpal Left 02/07/2014    Procedure: OPEN REDUCTION INTERNAL FIXATION (ORIF)  LEFT RING FINGER METACARPAL;  Surgeon: Betha Loa, MD;  Location: [facility];  Service: Orthopedics;  Laterality: Left;   Family History  Problem Relation Age of Onset  . Hypertension Mother   . Diabetes Mother   . Cancer Mother   . Anesthesia problems Mother     hard to wake up post-op  . Cancer Father   . Diabetes Father   . Hypertension Father    Social History   Substance Use Topics  . Smoking status: Current Some Day Smoker -- 10 years    Types: Cigarettes  . Smokeless tobacco: Never Used     Comment: 1 pack/week  . Alcohol Use: Yes     Comment: weekends    Review of Systems  All other systems reviewed and are negative.     Allergies  Bactrim; Coconut oil; Pork-derived products; and Soap  Home Medications   Prior to Admission medications   Medication Sig Start Date End Date Taking? Authorizing Provider  albuterol (PROVENTIL HFA;VENTOLIN HFA) 108 (90 BASE) MCG/ACT inhaler Inhale 1 puff into the lungs every 6 (six) hours as needed for wheezing or shortness of breath. 12/31/13   Mellody Drown, PA-C  famotidine (PEPCID) 20 MG tablet Take 1 tablet (20 mg total) by mouth 2 (two) times daily. 05/02/14   Vanetta Mulders, MD  fluticasone (FLONASE) 50 MCG/ACT nasal spray Place 2 sprays into both nostrils daily. 03/24/15   Eber Hong, MD  ibuprofen (ADVIL,MOTRIN) 800 MG tablet Take 1 tablet (800 mg total) by mouth 3 (three) times daily. 03/24/15   Eber Hong, MD   There were no vitals taken for this visit. Physical Exam  Constitutional: He appears well-developed and well-nourished.  HENT:  Head: Normocephalic and atraumatic.  Erythematous oropharynx, no exudate asymmetry or hypertrophy, uvula is midline, phonation is normal, mucous members are moist  Eyes: Conjunctivae are normal. Right eye exhibits no discharge. Left  eye exhibits no discharge.  Cardiovascular:  No tachycardia, normal pulses  Pulmonary/Chest: Effort normal. No respiratory distress. He has no wheezes. He has no rales.  Neurological: He is alert. Coordination normal.  Skin: Skin is warm and dry. No rash noted. He is not diaphoretic. No erythema.  Psychiatric: He has a normal mood and affect.  Nursing note and vitals reviewed.   ED Course  Procedures (including critical care time) Labs Review Labs Reviewed - No data to display  Imaging Review No results found. I  have personally reviewed and evaluated these images and lab results as part of my medical decision-making.   EKG Interpretation None      MDM   Final diagnoses:  Pharyngitis    The patient speaks in full sentences, he does not appear to be in distress, he has an erythematous painful throat which is consistent with a strep infection, at this time we'll forego testing and does give penicillin treatment, the patient is in agreement. He also reports that he wants his feet checked because his work states that he needs to be seen by a foot doctor. We'll give referrals, brief examination of the feet shows that he has several areas of callus and blister, no acute findings to suggest the need for intervention or further workup in the emergency department  Meds given in ED:  Medications  penicillin g benzathine (BICILLIN LA) 1200000 UNIT/2ML injection 1.2 Million Units (not administered)    New Prescriptions   FLUTICASONE (FLONASE) 50 MCG/ACT NASAL SPRAY    Place 2 sprays into both nostrils daily.   IBUPROFEN (ADVIL,MOTRIN) 800 MG TABLET    Take 1 tablet (800 mg total) by mouth 3 (three) times daily.        Eber HongBrian Kien Mirsky, MD 03/24/15 [phone]

## 2015-03-24 NOTE — ED Notes (Signed)
Pt had throat pain and vomiting yesterday. Pt came to the ED for further eval.

## 2015-03-24 NOTE — Discharge Instructions (Signed)

## 2019-11-22 DEATH — deceased
